# Patient Record
Sex: Female | Born: 1935 | Race: White | Hispanic: No | Smoking: Former smoker
Health system: Southern US, Community
[De-identification: ages and names within clinical notes are randomized; demographics above are authoritative.]

## PROBLEM LIST (undated history)

## (undated) DIAGNOSIS — J309 Allergic rhinitis, unspecified: Secondary | ICD-10-CM

## (undated) DIAGNOSIS — H25019 Cortical age-related cataract, unspecified eye: Secondary | ICD-10-CM

## (undated) DIAGNOSIS — I872 Venous insufficiency (chronic) (peripheral): Secondary | ICD-10-CM

## (undated) DIAGNOSIS — M533 Sacrococcygeal disorders, not elsewhere classified: Secondary | ICD-10-CM

## (undated) DIAGNOSIS — F39 Unspecified mood [affective] disorder: Secondary | ICD-10-CM

## (undated) DIAGNOSIS — M21371 Foot drop, right foot: Secondary | ICD-10-CM

## (undated) DIAGNOSIS — Z96641 Presence of right artificial hip joint: Secondary | ICD-10-CM

## (undated) DIAGNOSIS — N183 Chronic kidney disease, stage 3 unspecified: Secondary | ICD-10-CM

## (undated) DIAGNOSIS — E871 Hypo-osmolality and hyponatremia: Secondary | ICD-10-CM

## (undated) DIAGNOSIS — H3530 Unspecified macular degeneration: Secondary | ICD-10-CM

## (undated) DIAGNOSIS — M48061 Spinal stenosis, lumbar region without neurogenic claudication: Secondary | ICD-10-CM

## (undated) DIAGNOSIS — M810 Age-related osteoporosis without current pathological fracture: Secondary | ICD-10-CM

## (undated) DIAGNOSIS — I499 Cardiac arrhythmia, unspecified: Secondary | ICD-10-CM

## (undated) DIAGNOSIS — I341 Nonrheumatic mitral (valve) prolapse: Secondary | ICD-10-CM

## (undated) DIAGNOSIS — R5381 Other malaise: Secondary | ICD-10-CM

## (undated) DIAGNOSIS — K5900 Constipation, unspecified: Secondary | ICD-10-CM

## (undated) DIAGNOSIS — M1990 Unspecified osteoarthritis, unspecified site: Secondary | ICD-10-CM

## (undated) DIAGNOSIS — Z9189 Other specified personal risk factors, not elsewhere classified: Secondary | ICD-10-CM

## (undated) DIAGNOSIS — IMO0002 Reserved for concepts with insufficient information to code with codable children: Secondary | ICD-10-CM

## (undated) DIAGNOSIS — F419 Anxiety disorder, unspecified: Secondary | ICD-10-CM

## (undated) DIAGNOSIS — I10 Essential (primary) hypertension: Secondary | ICD-10-CM

## (undated) DIAGNOSIS — E559 Vitamin D deficiency, unspecified: Secondary | ICD-10-CM

## (undated) DIAGNOSIS — D649 Anemia, unspecified: Secondary | ICD-10-CM

## (undated) DIAGNOSIS — K219 Gastro-esophageal reflux disease without esophagitis: Secondary | ICD-10-CM

## (undated) DIAGNOSIS — J90 Pleural effusion, not elsewhere classified: Secondary | ICD-10-CM

## (undated) DIAGNOSIS — R5383 Other fatigue: Secondary | ICD-10-CM

## (undated) DIAGNOSIS — S2249XA Multiple fractures of ribs, unspecified side, initial encounter for closed fracture: Secondary | ICD-10-CM

## (undated) DIAGNOSIS — J189 Pneumonia, unspecified organism: Secondary | ICD-10-CM

## (undated) HISTORY — DX: Multiple fractures of ribs, unspecified side, initial encounter for closed fracture: S22.49XA

## (undated) HISTORY — DX: Sacrococcygeal disorders, not elsewhere classified: M53.3

## (undated) HISTORY — DX: Chronic kidney disease, stage 3 unspecified: N18.30

## (undated) HISTORY — PX: COLONOSCOPY: SHX174

## (undated) HISTORY — DX: Chronic kidney disease, stage 3 (moderate): N18.3

## (undated) HISTORY — DX: Other fatigue: R53.81

## (undated) HISTORY — DX: Unspecified macular degeneration: H35.30

## (undated) HISTORY — PX: CATARACT EXTRACTION W/ INTRAOCULAR LENS  IMPLANT, BILATERAL: SHX1307

## (undated) HISTORY — DX: Vitamin D deficiency, unspecified: E55.9

## (undated) HISTORY — PX: JOINT REPLACEMENT: SHX530

## (undated) HISTORY — DX: Hypo-osmolality and hyponatremia: E87.1

## (undated) HISTORY — DX: Unspecified osteoarthritis, unspecified site: M19.90

## (undated) HISTORY — PX: TONSILLECTOMY: SUR1361

## (undated) HISTORY — DX: Other fatigue: R53.83

## (undated) HISTORY — DX: Anxiety disorder, unspecified: F41.9

## (undated) HISTORY — DX: Cardiac arrhythmia, unspecified: I49.9

## (undated) HISTORY — DX: Allergic rhinitis, unspecified: J30.9

## (undated) HISTORY — DX: Anemia, unspecified: D64.9

## (undated) HISTORY — DX: Cortical age-related cataract, unspecified eye: H25.019

## (undated) HISTORY — DX: Age-related osteoporosis without current pathological fracture: M81.0

## (undated) HISTORY — DX: Spinal stenosis, lumbar region without neurogenic claudication: M48.061

## (undated) HISTORY — DX: Pleural effusion, not elsewhere classified: J90

## (undated) HISTORY — DX: Reserved for concepts with insufficient information to code with codable children: IMO0002

## (undated) HISTORY — DX: Foot drop, right foot: M21.371

## (undated) HISTORY — DX: Venous insufficiency (chronic) (peripheral): I87.2

## (undated) HISTORY — DX: Unspecified mood (affective) disorder: F39

## (undated) HISTORY — DX: Other specified personal risk factors, not elsewhere classified: Z91.89

## (undated) HISTORY — DX: Essential (primary) hypertension: I10

## (undated) HISTORY — DX: Presence of right artificial hip joint: Z96.641

---

## 2003-12-22 HISTORY — PX: TOTAL HIP ARTHROPLASTY: SHX124

## 2004-01-02 ENCOUNTER — Inpatient Hospital Stay (HOSPITAL_COMMUNITY): Admission: RE | Admit: 2004-01-02 | Discharge: 2004-01-06 | Payer: Self-pay | Admitting: Orthopedic Surgery

## 2007-11-29 ENCOUNTER — Ambulatory Visit (HOSPITAL_COMMUNITY): Admission: RE | Admit: 2007-11-29 | Discharge: 2007-11-29 | Payer: Self-pay | Admitting: Internal Medicine

## 2008-12-21 DIAGNOSIS — H25019 Cortical age-related cataract, unspecified eye: Secondary | ICD-10-CM

## 2008-12-21 HISTORY — PX: REPLACEMENT TOTAL KNEE: SUR1224

## 2008-12-21 HISTORY — DX: Cortical age-related cataract, unspecified eye: H25.019

## 2009-09-04 ENCOUNTER — Inpatient Hospital Stay (HOSPITAL_COMMUNITY): Admission: RE | Admit: 2009-09-04 | Discharge: 2009-09-07 | Payer: Self-pay | Admitting: Orthopedic Surgery

## 2011-03-27 LAB — CBC
HCT: 19.6 % — ABNORMAL LOW (ref 36.0–46.0)
HCT: 23.1 % — ABNORMAL LOW (ref 36.0–46.0)
HCT: 25.6 % — ABNORMAL LOW (ref 36.0–46.0)
HCT: 32.7 % — ABNORMAL LOW (ref 36.0–46.0)
Hemoglobin: 6.8 g/dL — CL (ref 12.0–15.0)
Hemoglobin: 8 g/dL — ABNORMAL LOW (ref 12.0–15.0)
Hemoglobin: 8.9 g/dL — ABNORMAL LOW (ref 12.0–15.0)
Hemoglobin: 11.3 g/dL — ABNORMAL LOW (ref 12.0–15.0)
MCHC: 34.4 g/dL (ref 30.0–36.0)
MCHC: 34.4 g/dL (ref 30.0–36.0)
MCHC: 34.8 g/dL (ref 30.0–36.0)
MCHC: 34.7 g/dL (ref 30.0–36.0)
MCV: 94.3 fL (ref 78.0–100.0)
MCV: 95.3 fL (ref 78.0–100.0)
MCV: 95.4 fL (ref 78.0–100.0)
MCV: 94.8 fL (ref 78.0–100.0)
Platelets: 132 10*3/uL — ABNORMAL LOW (ref 150–400)
Platelets: 136 10*3/uL — ABNORMAL LOW (ref 150–400)
Platelets: 146 10*3/uL — ABNORMAL LOW (ref 150–400)
Platelets: 240 10*3/uL (ref 150–400)
RBC: 2.06 MIL/uL — ABNORMAL LOW (ref 3.87–5.11)
RBC: 2.43 MIL/uL — ABNORMAL LOW (ref 3.87–5.11)
RBC: 2.72 MIL/uL — ABNORMAL LOW (ref 3.87–5.11)
RBC: 3.45 MIL/uL — ABNORMAL LOW (ref 3.87–5.11)
RDW: 13.3 % (ref 11.5–15.5)
RDW: 13.6 % (ref 11.5–15.5)
RDW: 14 % (ref 11.5–15.5)
RDW: 13.2 % (ref 11.5–15.5)
WBC: 6.2 10*3/uL (ref 4.0–10.5)
WBC: 6.6 10*3/uL (ref 4.0–10.5)
WBC: 9.1 10*3/uL (ref 4.0–10.5)
WBC: 6.8 10*3/uL (ref 4.0–10.5)

## 2011-03-27 LAB — COMPREHENSIVE METABOLIC PANEL
ALT: 27 U/L (ref 0–35)
AST: 32 U/L (ref 0–37)
Albumin: 4 g/dL (ref 3.5–5.2)
Alkaline Phosphatase: 74 U/L (ref 39–117)
BUN: 20 mg/dL (ref 6–23)
CO2: 27 mEq/L (ref 19–32)
Calcium: 9 mg/dL (ref 8.4–10.5)
Chloride: 96 mEq/L (ref 96–112)
Creatinine, Ser: 0.91 mg/dL (ref 0.4–1.2)
GFR calc Af Amer: >60 mL/min (ref >60)
GFR calc non Af Amer: 59 mL/min — ABNORMAL LOW (ref >60)
Glucose, Bld: 90 mg/dL (ref 70–99)
Potassium: 4.3 mEq/L (ref 3.5–5.1)
Sodium: 133 mEq/L — ABNORMAL LOW (ref 135–145)
Total Bilirubin: 0.7 mg/dL (ref 0.3–1.2)
Total Protein: 6.4 g/dL (ref 6.0–8.3)

## 2011-03-27 LAB — BASIC METABOLIC PANEL
BUN: 12 mg/dL (ref 6–23)
BUN: 7 mg/dL (ref 6–23)
BUN: 9 mg/dL (ref 6–23)
CO2: 24 mEq/L (ref 19–32)
CO2: 26 mEq/L (ref 19–32)
CO2: 29 mEq/L (ref 19–32)
Calcium: 6.9 mg/dL — ABNORMAL LOW (ref 8.4–10.5)
Calcium: 7.8 mg/dL — ABNORMAL LOW (ref 8.4–10.5)
Calcium: 8.1 mg/dL — ABNORMAL LOW (ref 8.4–10.5)
Chloride: 92 mEq/L — ABNORMAL LOW (ref 96–112)
Chloride: 94 mEq/L — ABNORMAL LOW (ref 96–112)
Chloride: 95 mEq/L — ABNORMAL LOW (ref 96–112)
Creatinine, Ser: 0.74 mg/dL (ref 0.4–1.2)
Creatinine, Ser: 0.79 mg/dL (ref 0.4–1.2)
Creatinine, Ser: 0.81 mg/dL (ref 0.4–1.2)
GFR calc Af Amer: >60 mL/min (ref >60)
GFR calc Af Amer: >60 mL/min (ref >60)
GFR calc Af Amer: >60 mL/min (ref >60)
GFR calc non Af Amer: >60 mL/min (ref >60)
GFR calc non Af Amer: >60 mL/min (ref >60)
GFR calc non Af Amer: >60 mL/min (ref >60)
Glucose, Bld: 109 mg/dL — ABNORMAL HIGH (ref 70–99)
Glucose, Bld: 127 mg/dL — ABNORMAL HIGH (ref 70–99)
Glucose, Bld: 148 mg/dL — ABNORMAL HIGH (ref 70–99)
Potassium: 3.6 mEq/L (ref 3.5–5.1)
Potassium: 3.6 mEq/L (ref 3.5–5.1)
Potassium: 3.9 mEq/L (ref 3.5–5.1)
Sodium: 122 mEq/L — ABNORMAL LOW (ref 135–145)
Sodium: 130 mEq/L — ABNORMAL LOW (ref 135–145)
Sodium: 130 mEq/L — ABNORMAL LOW (ref 135–145)

## 2011-03-27 LAB — CROSSMATCH
ABO/RH(D): O POS
Antibody Screen: NEGATIVE

## 2011-03-27 LAB — APTT: aPTT: 30 seconds (ref 24–37)

## 2011-03-27 LAB — PROTIME-INR
INR: 1.1 (ref 0.00–1.49)
INR: 1.6 — ABNORMAL HIGH (ref 0.00–1.49)
INR: 1.7 — ABNORMAL HIGH (ref 0.00–1.49)
INR: 1 (ref 0.00–1.49)
Prothrombin Time: 14.3 seconds (ref 11.6–15.2)
Prothrombin Time: 18.9 seconds — ABNORMAL HIGH (ref 11.6–15.2)
Prothrombin Time: 20 seconds — ABNORMAL HIGH (ref 11.6–15.2)
Prothrombin Time: 13 seconds (ref 11.6–15.2)

## 2011-03-27 LAB — ABO/RH: ABO/RH(D): O POS

## 2011-05-08 NOTE — Discharge Summary (Signed)
NAME:  [name], [name]                             ACCOUNT NO.:  [id number]   MEDICAL RECORD NO.:  [id number]                   PATIENT TYPE:  INP   LOCATION:  5035                                 FACILITY:  [facility]   PHYSICIAN:  Burnard Bunting, M.D.                 DATE OF BIRTH:  05/12/1924   DATE OF ADMISSION:  01/02/2004  DATE OF DISCHARGE:                                 DISCHARGE SUMMARY   DISCHARGE DIAGNOSIS:  Left hip arthritis.   SECONDARY DIAGNOSES:  History of hypertension and breast cancer.   OPERATION:  Left total hip replacement.   HISTORY OF PRESENT ILLNESS:  The patient is a 77 year old female with left  hip osteoarthritis. She has failed conservative care. She presents now for  operative management.   HOSPITAL COURSE:  The patient was admitted to the orthopedic service January 02, 2004.  At that time, she underwent left total hip arthroplasty. The  patient did well from that procedure.  She was started on Coumadin for DVT  prophylaxis.  She was started on weightbearing as tolerated.  Postop leg  lengths were equal and motor sensory function to the left lower extremity  was intact.  The patient's hemoglobin was 9.3 at the time of discharge, her  INR was 1.5.  The patient will followup with Dr. Lajoyce Corners in seven days.  She  will be weightbearing as tolerated.  She needs to keep the incision dry.   DISCHARGE MEDICATIONS:  1. Norvasc 5 mg p.o. q.d.  2. Multivitamin.  3. Hydrochlorothiazide 25 mg p.o. q.d.  4. Coumadin approximately 5 mg p.o. q.d. until INR 2 to 2.5.  5. Percocet 1 to 2 p.o. q. 3-4h. p.r.n. pain.                                                Burnard Bunting, M.D.    GSD/MEDQ  D:  01/05/2004  T:  01/05/2004  Job:  [id number]

## 2011-05-08 NOTE — H&P (Signed)
NAME:  [name], [name]                             ACCOUNT NO.:  [id number]   MEDICAL RECORD NO.:  [id number]                   PATIENT TYPE:  INP   LOCATION:  2550                                 FACILITY:  [facility]   PHYSICIAN:  Nadara Mustard, M.D.                DATE OF BIRTH:  [birth year]   DATE OF ADMISSION:  01/02/2004  DATE OF DISCHARGE:                                HISTORY & PHYSICAL   HISTORY OF PRESENT ILLNESS:  The patient is a 77 year old woman with  osteoarthritis of her left hip. The patient has failed conservative care  including activity modification and nonsteroidals and presents at this time  for a total hip arthroplasty.   ALLERGIES:  No known drug allergies.   MEDICATIONS:  1. Norvasc 5 mg daily.  2. Multivitamin daily.  3. Hydrochlorothiazide 25 mg daily.   PAST MEDICAL HISTORY:  No previous surgery.   SOCIAL HISTORY:  Positive for alcohol, negative for tobacco.   FAMILY HISTORY:  Positive for breast cancer and hypertension.   REVIEW OF SYSTEMS:  Positive for osteoarthritis and hypertension.   PHYSICAL EXAMINATION:  VITAL SIGNS:  Temperature 97.3, pulse 74, respiratory  rate 16, blood pressure 126/78.  GENERAL:  She was in no acute distress.  LUNGS:  Clear to auscultation.  CARDIOVASCULAR:  Regular rate and rhythm.  NECK:  Supple. No bruits.  EXTREMITIES:  Examination of her left lower extremity she has internal and  external rotation of 20 degrees of the left hip. She has extension to  neutral, flexion to 90 degrees. Radiograph shows left hip bone on bone  contact, osteoarthritis.   ASSESSMENT:  Osteoarthritis left hip.   PLAN:  The patient is scheduled for a left total hip arthroplasty at this  time. The risks and benefits were discussed including infection,  neurovascular injury, persistent pain, failure of the implant, dislocation,  DVT, pulmonary embolus, as well as leg length inequality. The patient states  she understands and wishes to proceed at  this time. She wishes to plan for  rehab at [facility].                                                Nadara Mustard, M.D.    MVD/MEDQ  D:  01/02/2004  T:  01/02/2004  Job:  [id number]

## 2011-05-08 NOTE — Op Note (Signed)
NAME:  [name], [name]                             ACCOUNT NO.:  [id number]   MEDICAL RECORD NO.:  [id number]                   PATIENT TYPE:  INP   LOCATION:  2550                                 FACILITY:  [facility]   PHYSICIAN:  Nadara Mustard, M.D.                DATE OF BIRTH:  1924/11/28   DATE OF PROCEDURE:  01/02/2004  DATE OF DISCHARGE:                                 OPERATIVE REPORT   PREOPERATIVE DIAGNOSIS:  Left hip osteoarthritis.   POSTOPERATIVE DIAGNOSIS:  Left hip osteoarthritis.   PROCEDURE:  Left total hip arthroplasty with a #2 Accolade stent; 50-mm  Osteonics acetabulum, +0 neck, 10-degree liner.   SURGEON:  Nadara Mustard, M.D.   ANESTHESIA:  General.   ESTIMATED BLOOD LOSS:  250 mL.   ANTIBIOTICS:  1 g of Kefzol.   DISPOSITION:  To PACU in stable condition.   INDICATIONS FOR PROCEDURE:  The patient is a 77 year old woman with severe  osteoarthritis of her left hip.  The patient states she has failed  conservative care and presents at this time for total hip arthroplasty. The  risk and benefits were discussed including infection, neurovascular injury,  persistent pain, dislocation of the hip, DVT, pulmonary embolus as well as  leg length in equality.  The patient states she understands and wishes to  proceed at this time.   DESCRIPTION OF PROCEDURE:  The patient was brought to OR room 4 and  underwent general anesthetic. After adequate level of anesthesia obtained,  the patient was placed in the right lateral decubitus position with the left  side up and her left lower extremity was prepped using DuraPrep, draped into  sterile field and I-band was used to cover all exposed skin.  A posterior  lateral incision was made.  This was carried down through the tensor fascia  lata which was split and a Charnley retractor placed.  The piriformis and  short external rotators were tagged, cut and retracted.  The capsule was  T'd, tagged and retracted. The hip was  dislocated and the neck cut was made  1 cm proximal to the calcar.  Attention was first focused on the acetabulum.  The labrum was excised, bony osteophytes were removed.  The acetabulum was  sequentially reamed to a 50 mm.  The 50 mm liner shell was placed.  Attention was then focused on the femur.  The femur was started with the all  then was sequentially broached to a #2 trial stem.  The trial acetabular  liner was placed with a 10 degree lip and the 32-mm head was inserted on the  stem and this was placed through a range of motion.  The patient had full  flexion of 100 degrees, full adduction and internal rotation of  approximately 60 degrees.  She had full extension and extension rotation  without any instability.  The trial implants were removed.  The wound was  irrigated throughout the case.  The centralizer plug was placed in the  acetabulum and the liner was placed with the 10-degree liner.  The final  stem was inserted with the +0 neck, 32-mm head.  The wound was again  irrigated and the hip reduced.  The hip was again placed through a full  range of motion.  She had full flexion of 100 degrees, full adduction and  internal rotation of approximately 60 degrees with both neutral and  adduction with flexion.  She had full extension with external rotation with  no instability.  The wound was again irrigated. The capsule was  reapproximated with the #1 Ethibond.  The piriformis and short external  rotators were reapproximated with a #1 Ethibond.  The sciatic nerve was  protected throughout the case.  The tensor fascia lata was closed using a #1  Vicryl running.  The subcu was closed using 2-0 Vicryl.  The skin was closed  using approximated staples.  The wound was covered with Adaptic orthopedic  sponges, ABD dressing, Hypafix tape and a knee immobilizer was applied.  The  patient was extubated and taken to the PACU in stable condition.                                                Nadara Mustard, M.D.    MVD/MEDQ  D:  01/02/2004  T:  01/02/2004  Job:  [id number]

## 2011-12-01 ENCOUNTER — Other Ambulatory Visit: Payer: Self-pay | Admitting: Dermatology

## 2011-12-01 ENCOUNTER — Ambulatory Visit
Admission: RE | Admit: 2011-12-01 | Discharge: 2011-12-01 | Disposition: A | Discharge status: Home / Self Care | Payer: Medicare Other | Source: Ambulatory Visit | Attending: Dermatology | Admitting: Dermatology

## 2011-12-01 DIAGNOSIS — L299 Pruritus, unspecified: Secondary | ICD-10-CM

## 2013-06-20 DIAGNOSIS — IMO0002 Reserved for concepts with insufficient information to code with codable children: Secondary | ICD-10-CM

## 2013-06-20 HISTORY — DX: Reserved for concepts with insufficient information to code with codable children: IMO0002

## 2013-07-06 ENCOUNTER — Encounter: Payer: Self-pay | Admitting: Geriatric Medicine

## 2013-07-06 ENCOUNTER — Non-Acute Institutional Stay (SKILLED_NURSING_FACILITY): Payer: Medicare Other | Admitting: Geriatric Medicine

## 2013-07-06 DIAGNOSIS — M25551 Pain in right hip: Secondary | ICD-10-CM

## 2013-07-06 DIAGNOSIS — M25559 Pain in unspecified hip: Secondary | ICD-10-CM

## 2013-07-06 DIAGNOSIS — S81809A Unspecified open wound, unspecified lower leg, initial encounter: Secondary | ICD-10-CM | POA: Insufficient documentation

## 2013-07-06 DIAGNOSIS — K5900 Constipation, unspecified: Secondary | ICD-10-CM

## 2013-07-06 NOTE — Progress Notes (Signed)
Patient ID: [name], female   DOB: March 09, 1924, 79 y.o.   MRN: [id number] [facility] [phone])  Chief Complaint  Patient presents with  . Back Pain    HPI: This is a 79 y.o. female resident of [facility] Independent Living  section. This patient had a fall 06/24/2013 while visiting in [facility]. She was apparently carrying frogs in the house and went outside fell on a brick sidewalk, fell on her right side. She got herself up initially no pain. She notes that she did hit her forehead, no loss of consciousness. She immediately noticed a hematoma on her right shin, he developed bruising of the left lateral and lateral thigh and hip on the right. The next day she developed some right hip pain did not treat herself with any medication no ice application. She drove herself home on July 6, had no pain until she got out of the car. Her right hip pain has gotten progressively worse since that time. She was evaluated at the orthopedic office on July 8 with a right hip x-ray that revealed no fracture pain progressed to involve her lower back, she returned for orthopedic office on July 16 had x-ray of her lower back that did not reveal any fracture. Today patient reports severe back pain, right lower back and buttock, worse than any of the pain she's had. She is unable to roll over today. This patient's primary care provider is Dr. Felipa Eth, while in the rehabilitation section she will be followed by [facility] Patient's other medical problems include fatigue, stress, hypertension, degenerative joint disease. She reports fatigue is much better after her recent increase in Cymbalta dose to 60 mg daily.   No Known Allergies Medications Reviewed     PMH  Past Medical History  Diagnosis Date  . Arthritis   . Osteoporosis   . Essential hypertension, benign   . Osteoarthrosis, unspecified whether generalized or localized, unspecified site   .  Other malaise and fatigue     re: stress, anxiety  . Allergic rhinitis, cause unspecified   . Osteoporosis/osteopenia increased risk   . Macular degeneration   . Venous insufficiency   . Cataract cortical, senile 2010    s/p bil extract/IOL implants    Past Surgical History  Procedure Laterality Date  . Total hip arthroplasty Left 2005  . Replacement total knee Right 2010   Family History  Problem Relation Age of Onset  . Esophageal cancer Mother 72  . Breast cancer Daughter   . Thyroid disease Daughter    History   Social History Narrative    Widowed 1988.  35yr college education.  Patient lives in  single level home at [facility] since 2003. Lives alone, continues to drive, attends social activities. Stopped smoking many years ago, drinks daily alcohol, 3oz. Scotch.   Patient has Advanced planning documents: Living Will, DNR            Review of Systems  DATA OBTAINED: from patient,  GENERAL:   No fevers, fatigue, change in appetite or weight SKIN: No itch, rash. Wound rt. Lower leg EYES: No eye pain, dryness or itching  No change in vision EARS: No earache, tinnitus, change in hearing NOSE: No congestion, drainage or bleeding MOUTH/THROAT: No mouth or tooth pain  No sore throat No difficulty chewing or swallowing RESPIRATORY: No cough, wheezing, SOB CARDIAC: No chest pain, palpitations  No edema. GI: No abdominal pain  No N/V/D. Constipation  No heartburn or reflux  GU: No dysuria, frequency or urgency  No change in urine volume or character   MUSCULOSKELETAL: No joint pain, swelling or stiffness  Back pain- see HPI   NEUROLOGIC: No dizziness, fainting, headache,   No change in mental status.  PSYCHIATRIC: No feelings of anxiety, depression Sleeps well.    Physical Exam Filed Vitals:   07/06/13 1425  BP: 134/71  Pulse: 83  Temp: 97.8 F (36.6 C)  Resp: 20  Height: 5\' 3"  (1.6 m)  Weight: 138 lb (62.596 kg)  SpO2: 96%   Body mass index is  24.45 kg/(m^2).  GENERAL APPEARANCE: No acute distress, appropriately groomed, normal body habitus. Alert, pleasant, conversant. SKIN: No diaphoresis, rash, unusual lesions  Resolving hematoma right anterior shin. Significant ecchymosis over the lateral right hip. HEAD: Normocephalic, resolving small hematoma right forehead EYES: Conjunctiva/lids clear. Pupils round, reactive. EOMs intact.  EARS: External exam WNL,. Hearing grossly normal. NOSE: No deformity or discharge. MOUTH/THROAT: Lips w/o lesions. Oral mucosa, tongue moist, w/o lesion. Oropharynx w/o redness or lesions.  NECK: Supple, full ROM. No thyroid tenderness, enlargement or nodule LYMPHATICS: No head, neck or supraclavicular adenopathy RESPIRATORY: Breathing is even, unlabored. Lung sounds are clear and full.  CARDIOVASCULAR: Heart RRR. No murmur or extra heart sounds  ARTERIAL: No carotid, bruit. DP,PT pulse 2+.  VENOUS: Venous stasis skin changes bilateral lower legs  EDEMA: No peripheral or periorbital edema.  GASTROINTESTINAL: Abdomen is soft, non-tender, not distended w/ normal bowel sounds. MUSCULOSKELETAL: Moves UE extremities with full ROM, strength and tone. Back is without kyphosis, scoliosis. Tender lateral right hip, unable to lift leg straight up without severe pain, pain is posterior NEUROLOGIC: Oriented to time, place, person. Cranial nerves 2-12 grossly intact, speech clear, no tremor.  PSYCHIATRIC: Mood and affect appropriate to situation  ASSESSMENT/PLAN   Right hip pain Persistent and worsening right hip and right lower back pain after fall 06/24/2013. Recent plain films of the right hip and lower back have been negative for fractures. Current presentation is concerning for pelvic fracture or sacral fracture. Will obtain x-rays of the right hip femur pelvis and sacrum. Change HEENT change pain management Vicodin 04/22/2024 q.6 hours scheduled. Nursing staff continue to monitor this patient closely and assist  with all transfers and ADLs  Unspecified constipation Patient reports constipation is usually a problem for her, this will be worsened with scheduled Vicodin. Start daily dosing of MiraLax.     Follow up: As needed  Thomasena Vandenheuvel T.Atianna Haidar, NP-C 07/06/2013

## 2013-07-07 ENCOUNTER — Inpatient Hospital Stay (HOSPITAL_COMMUNITY)
Admission: EM | Admit: 2013-07-07 | Discharge: 2013-07-09 | DRG: 552 | Disposition: A | Discharge status: Skilled Nursing Facility | Payer: Medicare Other | Attending: Orthopedic Surgery | Admitting: Orthopedic Surgery

## 2013-07-07 ENCOUNTER — Encounter (HOSPITAL_COMMUNITY): Payer: Self-pay | Admitting: Emergency Medicine

## 2013-07-07 DIAGNOSIS — Z79899 Other long term (current) drug therapy: Secondary | ICD-10-CM

## 2013-07-07 DIAGNOSIS — Z96659 Presence of unspecified artificial knee joint: Secondary | ICD-10-CM

## 2013-07-07 DIAGNOSIS — M51379 Other intervertebral disc degeneration, lumbosacral region without mention of lumbar back pain or lower extremity pain: Secondary | ICD-10-CM | POA: Diagnosis present

## 2013-07-07 DIAGNOSIS — S3210XA Unspecified fracture of sacrum, initial encounter for closed fracture: Principal | ICD-10-CM

## 2013-07-07 DIAGNOSIS — W19XXXA Unspecified fall, initial encounter: Secondary | ICD-10-CM | POA: Diagnosis present

## 2013-07-07 DIAGNOSIS — Z96649 Presence of unspecified artificial hip joint: Secondary | ICD-10-CM

## 2013-07-07 DIAGNOSIS — M545 Low back pain: Secondary | ICD-10-CM

## 2013-07-07 DIAGNOSIS — M5137 Other intervertebral disc degeneration, lumbosacral region: Secondary | ICD-10-CM | POA: Diagnosis present

## 2013-07-07 HISTORY — DX: Unspecified osteoarthritis, unspecified site: M19.90

## 2013-07-07 HISTORY — DX: Age-related osteoporosis without current pathological fracture: M81.0

## 2013-07-07 LAB — URINALYSIS, ROUTINE W REFLEX MICROSCOPIC
Bilirubin Urine: NEGATIVE
Hgb urine dipstick: NEGATIVE
Ketones, ur: NEGATIVE mg/dL
Specific Gravity, Urine: 1.012 (ref 1.005–1.030)
pH: 5.5 (ref 5.0–8.0)

## 2013-07-07 LAB — URINE MICROSCOPIC-ADD ON

## 2013-07-07 MED ORDER — OCUVITE PO TABS
1.0000 | ORAL_TABLET | Freq: Every day | ORAL | Status: DC
  Administered 2013-07-08 – 2013-07-09 (×2): 1 via ORAL
  Filled 2013-07-07 (×2): qty 1

## 2013-07-07 MED ORDER — AMLODIPINE BESYLATE 5 MG PO TABS
5.0000 mg | ORAL_TABLET | Freq: Every day | ORAL | Status: DC
  Administered 2013-07-08 – 2013-07-09 (×2): 5 mg via ORAL
  Filled 2013-07-07 (×2): qty 1

## 2013-07-07 MED ORDER — DULOXETINE HCL 60 MG PO CPEP
60.0000 mg | ORAL_CAPSULE | Freq: Every day | ORAL | Status: DC
  Administered 2013-07-08 – 2013-07-09 (×2): 60 mg via ORAL
  Filled 2013-07-07 (×2): qty 1

## 2013-07-07 MED ORDER — SENNA 8.6 MG PO TABS
1.0000 | ORAL_TABLET | Freq: Every evening | ORAL | Status: DC | PRN
  Administered 2013-07-08: 8.6 mg via ORAL
  Filled 2013-07-07: qty 1

## 2013-07-07 MED ORDER — POLYETHYLENE GLYCOL 3350 17 G PO PACK
17.0000 g | PACK | Freq: Every day | ORAL | Status: DC
  Administered 2013-07-08 – 2013-07-09 (×2): 17 g via ORAL
  Filled 2013-07-07 (×2): qty 1

## 2013-07-07 MED ORDER — GABAPENTIN 100 MG PO CAPS
100.0000 mg | ORAL_CAPSULE | Freq: Every day | ORAL | Status: DC
  Administered 2013-07-08: 100 mg via ORAL
  Filled 2013-07-07 (×3): qty 1

## 2013-07-07 MED ORDER — VITAMIN D (ERGOCALCIFEROL) 1.25 MG (50000 UNIT) PO CAPS
50000.0000 [IU] | ORAL_CAPSULE | ORAL | Status: DC
  Administered 2013-07-08: 50000 [IU] via ORAL
  Filled 2013-07-07: qty 1

## 2013-07-07 MED ORDER — POTASSIUM CHLORIDE CRYS ER 20 MEQ PO TBCR
20.0000 meq | EXTENDED_RELEASE_TABLET | Freq: Every day | ORAL | Status: DC
  Administered 2013-07-08 – 2013-07-09 (×2): 20 meq via ORAL
  Filled 2013-07-07 (×2): qty 1

## 2013-07-07 MED ORDER — CYCLOBENZAPRINE HCL 10 MG PO TABS
10.0000 mg | ORAL_TABLET | Freq: Three times a day (TID) | ORAL | Status: DC | PRN
  Administered 2013-07-08 – 2013-07-09 (×4): 10 mg via ORAL
  Filled 2013-07-07 (×4): qty 1

## 2013-07-07 MED ORDER — PREDNISONE 20 MG PO TABS
40.0000 mg | ORAL_TABLET | Freq: Once | ORAL | Status: AC
  Administered 2013-07-08: 40 mg via ORAL
  Filled 2013-07-07 (×2): qty 2

## 2013-07-07 MED ORDER — IRBESARTAN 75 MG PO TABS
75.0000 mg | ORAL_TABLET | Freq: Every day | ORAL | Status: DC
  Administered 2013-07-08 – 2013-07-09 (×2): 75 mg via ORAL
  Filled 2013-07-07 (×2): qty 1

## 2013-07-07 MED ORDER — HYDROMORPHONE HCL PF 1 MG/ML IJ SOLN: 0.5000 mg | INTRAMUSCULAR | Status: DC | PRN

## 2013-07-07 MED ORDER — ASPIRIN EC 81 MG PO TBEC
81.0000 mg | DELAYED_RELEASE_TABLET | Freq: Every day | ORAL | Status: DC
  Administered 2013-07-08: 81 mg via ORAL
  Filled 2013-07-07: qty 1

## 2013-07-07 MED ORDER — HYDROCHLOROTHIAZIDE 12.5 MG PO CAPS
12.5000 mg | ORAL_CAPSULE | Freq: Every day | ORAL | Status: DC
  Administered 2013-07-08 – 2013-07-09 (×2): 12.5 mg via ORAL
  Filled 2013-07-07 (×2): qty 1

## 2013-07-07 NOTE — ED Provider Notes (Signed)
History    CSN: [id number] Arrival date & time 07/07/13  2113  First MD Initiated Contact with Patient 07/07/13 2141     Chief Complaint  Patient presents with  . Fall   (Consider location/radiation/quality/duration/timing/severity/associated sxs/prior Treatment) HPI patient tripped and fell on 06/24/2013 suffering ground-level fall. Since then she complains of low back pain back pain. She reports some numbness in her right lower leg for the past few days and has had some trouble in getting her bladder. She was sent here today for further evaluation by Dr.Duda. Pain occurs when she moves or walks improved with remaining still. She denies pain presently. Past Medical History  Diagnosis Date  . Arthritis   . Osteoporosis    Past Surgical History  Procedure Laterality Date  . Total hip arthroplasty    . Replacement total knee     No family history on file. History  Substance Use Topics  . Smoking status: Former Smoker    Quit date: 07/07/1963  . Smokeless tobacco: Not on file  . Alcohol Use: 4.2 oz/week    7 Shots of liquor per week     Comment: drinks scotch daily.    OB History   Grav Para Term Preterm Abortions TAB SAB Ect Mult Living                 Review of Systems  Constitutional: Negative.   HENT: Negative.   Respiratory: Negative.   Cardiovascular: Negative.   Gastrointestinal: Negative.   Genitourinary: Positive for urgency.  Musculoskeletal: Positive for back pain.  Skin: Negative.   Neurological: Positive for numbness.  Psychiatric/Behavioral: Negative.   All other systems reviewed and are negative.    Allergies  Review of patient's allergies indicates no known allergies.  Home Medications   Current Outpatient Rx  Name  Route  Sig  Dispense  Refill  . amLODipine (NORVASC) 5 MG tablet   Oral   Take 5 mg by mouth daily.         Marland Kitchen aspirin EC 81 MG tablet   Oral   Take 81 mg by mouth daily.         . beta carotene w/minerals (OCUVITE)  tablet   Oral   Take 1 tablet by mouth daily.         . cyclobenzaprine (FLEXERIL) 10 MG tablet   Oral   Take 10 mg by mouth 3 (three) times daily as needed for muscle spasms.         . DULoxetine (CYMBALTA) 60 MG capsule   Oral   Take 60 mg by mouth daily.         . ergocalciferol (VITAMIN D2) 50000 UNITS capsule   Oral   Take 50,000 Units by mouth once a week.         . gabapentin (NEURONTIN) 100 MG capsule   Oral   Take 100 mg by mouth at bedtime.         . hydrochlorothiazide (MICROZIDE) 12.5 MG capsule   Oral   Take 12.5 mg by mouth daily.         Marland Kitchen HYDROcodone-acetaminophen (NORCO/VICODIN) 5-325 MG per tablet   Oral   Take 1 tablet by mouth every 6 (six) hours as needed for pain.         Marland Kitchen ibuprofen (ADVIL,MOTRIN) 200 MG tablet   Oral   Take 200 mg by mouth every 6 (six) hours as needed for pain.         Marland Kitchen  olmesartan (BENICAR) 20 MG tablet   Oral   Take 20 mg by mouth daily.         . polyethylene glycol (MIRALAX / GLYCOLAX) packet   Oral   Take 17 g by mouth daily.         . potassium chloride SA (K-DUR,KLOR-CON) 20 MEQ tablet   Oral   Take 20 mEq by mouth daily.         . predniSONE (DELTASONE) 10 MG tablet   Oral   Take 40 mg by mouth once.         . senna (SENOKOT) 8.6 MG TABS   Oral   Take 1 tablet by mouth at bedtime as needed (constipation).          BP 124/61  Pulse 86  Temp(Src) 98.2 F (36.8 C) (Oral)  Resp 21  SpO2 92% Physical Exam  Nursing note and vitals reviewed. Constitutional: She is oriented to person, place, and time. She appears well-developed and well-nourished.  HENT:  Head: Normocephalic.  Purplish ecchymosis at right for head otherwise normocephalic atraumatic  Eyes: Conjunctivae are normal. Pupils are equal, round, and reactive to light.  Neck: Neck supple. No tracheal deviation present. No thyromegaly present.  Cardiovascular: Normal rate and regular rhythm.   No murmur  heard. Pulmonary/Chest: Effort normal and breath sounds normal.  Abdominal: Soft. Bowel sounds are normal. She exhibits no distension. There is no tenderness.  Musculoskeletal: Normal range of motion. She exhibits no edema and no tenderness.  Entire spine nontender. Pain at lumbar area when she moves in bed. Motor strength 5 over 5 overall  Neurological: She is alert and oriented to person, place, and time. She has normal reflexes. No cranial nerve deficit. Coordination normal.  Skin: Skin is warm and dry. No rash noted.  Psychiatric: She has a normal mood and affect.    ED Course  Procedures (including critical care time) Labs Reviewed  URINALYSIS, ROUTINE W REFLEX MICROSCOPIC   No results found. No diagnosis found.  MDM  Foley catheter placed as patient has feeling of urinary urgency and has trouble moving. Spoke with Dr.Duda plan 23 hour observation MRI in a.m. Diagnosis #1 fall #2 low back pain #3 paresthesias  Doug Sou, MD 07/07/13 2219

## 2013-07-07 NOTE — ED Notes (Signed)
Per ems- pt from [facility]. fell on July 4th and has since had lower back pain with numbness in right foot. X-ray did not show anything remarkable. And here for MRI.

## 2013-07-08 ENCOUNTER — Inpatient Hospital Stay (HOSPITAL_COMMUNITY): Payer: Medicare Other

## 2013-07-08 ENCOUNTER — Encounter: Payer: Self-pay | Admitting: Geriatric Medicine

## 2013-07-08 DIAGNOSIS — M25551 Pain in right hip: Secondary | ICD-10-CM | POA: Insufficient documentation

## 2013-07-08 DIAGNOSIS — S3210XA Unspecified fracture of sacrum, initial encounter for closed fracture: Secondary | ICD-10-CM

## 2013-07-08 DIAGNOSIS — M48061 Spinal stenosis, lumbar region without neurogenic claudication: Secondary | ICD-10-CM

## 2013-07-08 DIAGNOSIS — K5903 Drug induced constipation: Secondary | ICD-10-CM | POA: Insufficient documentation

## 2013-07-08 HISTORY — DX: Spinal stenosis, lumbar region without neurogenic claudication: M48.061

## 2013-07-08 MED ORDER — ASPIRIN EC 325 MG PO TBEC
325.0000 mg | DELAYED_RELEASE_TABLET | Freq: Every day | ORAL | Status: DC
  Administered 2013-07-09: 325 mg via ORAL
  Filled 2013-07-08: qty 1

## 2013-07-08 MED ORDER — HYDROMORPHONE HCL PF 1 MG/ML IJ SOLN
0.5000 mg | INTRAMUSCULAR | Status: DC | PRN
  Administered 2013-07-08 (×2): 0.5 mg via INTRAVENOUS
  Filled 2013-07-08 (×2): qty 1

## 2013-07-08 MED ORDER — HYDROCODONE-ACETAMINOPHEN 5-325 MG PO TABS
1.0000 | ORAL_TABLET | ORAL | Status: DC | PRN
  Administered 2013-07-08 – 2013-07-09 (×4): 2 via ORAL
  Filled 2013-07-08 (×4): qty 2

## 2013-07-08 MED ORDER — PREDNISONE 20 MG PO TABS
20.0000 mg | ORAL_TABLET | Freq: Every day | ORAL | Status: DC
  Administered 2013-07-08 – 2013-07-09 (×2): 20 mg via ORAL
  Filled 2013-07-08 (×4): qty 1

## 2013-07-08 NOTE — Clinical Social Work Psychosocial (Signed)
Clinical Social Work Department BRIEF PSYCHOSOCIAL ASSESSMENT 07/08/2013  Patient:  [name], [name]     Account Number:  [id number]     Admit date:  07/07/2013  Clinical Social Worker:  Demetrios Loll  Date/Time:  07/08/2013 04:53 PM  Referred by:  Physician  Date Referred:  07/08/2013 Referred for  Other - See comment   Other Referral:   Pt was admitted from [facility]   Interview type:  Patient Other interview type:    PSYCHOSOCIAL DATA Living Status:  FACILITY Admitted from facility:  [facility] Level of care:  Skilled Nursing Facility Primary support name:  Dull,Jill Primary support relationship to patient:  CHILD, ADULT Degree of support available:   supportive    CURRENT CONCERNS Current Concerns  Post-Acute Placement   Other Concerns:    SOCIAL WORK ASSESSMENT / PLAN CSW met with pt to address consult. CSW introduced herself and explained role of social work. CSW also explained the process of discharging and returning to [facility]. Pt shared that she lives in Independent Living and is currently in the SNF portion of [facility] for rehab. Pt shared that she would like to return to SNF at discharge and continue to her physical therapy. CSW will follow with facility and facilitate discharge to SNF. CSW will continue to follow.   Assessment/plan status:  Psychosocial Support/Ongoing Assessment of Needs Other assessment/ plan:   Information/referral to community resources:   Pt is a resident of [facility].    PATIENT'S/FAMILY'S RESPONSE TO PLAN OF CARE: Pt was very pleasant and is agreeable to discharge plan.   Dede Query, MSW, LCSW Clinical Social Worker [facility] [phone]

## 2013-07-08 NOTE — H&P (Signed)
[name] is an 79 y.o. female.   Chief Complaint: Lower back pain with right-sided radicular symptoms with difficulty urinating. HPI: Patient is an 79 year old woman resident at [facility] who states that she had a ground-level fall trying to help frog. Patient complains of pain with trying to stand in her lower back numbness in her right foot.  Past Medical History  Diagnosis Date  . Arthritis   . Osteoporosis     Past Surgical History  Procedure Laterality Date  . Total hip arthroplasty    . Replacement total knee      No family history on file. Social History:  reports that she quit smoking about 50 years ago. She does not have any smokeless tobacco history on file. She reports that she drinks about 4.2 ounces of alcohol per week. She reports that she does not use illicit drugs.  Allergies: No Known Allergies  Medications Prior to Admission  Medication Sig Dispense Refill  . amLODipine (NORVASC) 5 MG tablet Take 5 mg by mouth daily.      Marland Kitchen aspirin EC 81 MG tablet Take 81 mg by mouth daily.      . beta carotene w/minerals (OCUVITE) tablet Take 1 tablet by mouth daily.      . cyclobenzaprine (FLEXERIL) 10 MG tablet Take 10 mg by mouth 3 (three) times daily as needed for muscle spasms.      . DULoxetine (CYMBALTA) 60 MG capsule Take 60 mg by mouth daily.      . ergocalciferol (VITAMIN D2) 50000 UNITS capsule Take 50,000 Units by mouth once a week.      . gabapentin (NEURONTIN) 100 MG capsule Take 100 mg by mouth at bedtime.      . hydrochlorothiazide (MICROZIDE) 12.5 MG capsule Take 12.5 mg by mouth daily.      Marland Kitchen HYDROcodone-acetaminophen (NORCO/VICODIN) 5-325 MG per tablet Take 1 tablet by mouth every 6 (six) hours as needed for pain.      Marland Kitchen ibuprofen (ADVIL,MOTRIN) 200 MG tablet Take 200 mg by mouth every 6 (six) hours as needed for pain.      Marland Kitchen olmesartan (BENICAR) 20 MG tablet Take 20 mg by mouth daily.      . polyethylene glycol (MIRALAX / GLYCOLAX) packet Take 17 g by mouth  daily.      . potassium chloride SA (K-DUR,KLOR-CON) 20 MEQ tablet Take 20 mEq by mouth daily.      . predniSONE (DELTASONE) 10 MG tablet Take 40 mg by mouth once.      . senna (SENOKOT) 8.6 MG TABS Take 1 tablet by mouth at bedtime as needed (constipation).        Results for orders placed during the hospital encounter of 07/07/13 (from the past 48 hour(s))  URINALYSIS, ROUTINE W REFLEX MICROSCOPIC     Status: Abnormal   Collection Time    07/07/13 10:16 PM      Result Value Range   Color, Urine YELLOW  YELLOW   APPearance CLOUDY (*) CLEAR   Specific Gravity, Urine 1.012  1.005 - 1.030   pH 5.5  5.0 - 8.0   Glucose, UA NEGATIVE  NEGATIVE mg/dL   Hgb urine dipstick NEGATIVE  NEGATIVE   Bilirubin Urine NEGATIVE  NEGATIVE   Ketones, ur NEGATIVE  NEGATIVE mg/dL   Protein, ur NEGATIVE  NEGATIVE mg/dL   Urobilinogen, UA 0.2  0.0 - 1.0 mg/dL   Nitrite POSITIVE (*) NEGATIVE   Leukocytes, UA SMALL (*) NEGATIVE  URINE MICROSCOPIC-ADD ON  Status: Abnormal   Collection Time    07/07/13 10:16 PM      Result Value Range   Squamous Epithelial / LPF RARE  RARE   WBC, UA 3-6  <3 WBC/hpf   RBC / HPF 0-2  <3 RBC/hpf   Bacteria, UA FEW (*) RARE   No results found.  Review of Systems  All other systems reviewed and are negative.    Blood pressure 131/68, pulse 81, temperature 98.5 F (36.9 C), temperature source Oral, resp. rate 16, height 5\' 3"  (1.6 m), weight 62.596 kg (138 lb), SpO2 93.00%. Physical Exam  On examination patient is alert oriented she denies any back pain while lying in bed. She notes no significant change with the prednisone that she received last night. Examination patient has slight weakness with EHL strength on the right and ankle dorsiflexion the right as well as E. version on the right with strength about 4/5 compared to the left. She states that resisted extension is painful. She has a positive straight leg raise on the right. Good strength with plantar flexion.  Previous radiographs showed a history of pelvic insufficiency fractures radiographs obtained in the office last week showed no acute compression fractures. Her spine is nontender. Assessment/Plan Assessment: Right sided radicular pain with radiculopathy and weakness rule out herniated disc versus acute compression fracture.  Plan: We'll obtain MRI scan of her lumbar spine this morning. Further treatment pending results of the MRI scan. Plan for discharge back to assisted living [facility].  Keyoni Lapinski V 07/08/2013, 7:09 AM

## 2013-07-08 NOTE — Assessment & Plan Note (Signed)
Patient reports constipation is usually a problem for her, this will be worsened with scheduled Vicodin. Start daily dosing of MiraLax.

## 2013-07-08 NOTE — Assessment & Plan Note (Signed)
Persistent and worsening right hip and right lower back pain after fall 06/24/2013. Recent plain films of the right hip and lower back have been negative for fractures. Current presentation is concerning for pelvic fracture or sacral fracture. Will obtain x-rays of the right hip femur pelvis and sacrum. Change HEENT change pain management Vicodin 04/22/2024 q.6 hours scheduled. Nursing staff continue to monitor this patient closely and assist with all transfers and ADLs

## 2013-07-09 LAB — GLUCOSE, CAPILLARY: Glucose-Capillary: 102 mg/dL — ABNORMAL HIGH (ref 70–99)

## 2013-07-09 MED ORDER — PREDNISONE (PAK) 10 MG PO TABS: 10.0000 mg | ORAL_TABLET | Freq: Every day | ORAL | Status: DC

## 2013-07-09 MED ORDER — HYDROCODONE-ACETAMINOPHEN 5-325 MG PO TABS: 1.0000 | ORAL_TABLET | Freq: Four times a day (QID) | ORAL | Status: DC | PRN

## 2013-07-09 NOTE — Discharge Summary (Signed)
Physician Discharge Summary  Patient ID: [name] MRN: [id number] DOB/AGE: 1924/10/19 79 y.o.  Admit date: 07/07/2013 Discharge date: 07/09/2013  Admission Diagnoses: Sacral fracture with degenerative disc disease lumbar spine  Discharge Diagnoses:  Principal Problem:   Sacral fracture, closed   Discharged Condition: stable  Hospital Course: Patient's hospital course was essentially unremarkable. She received an MRI scan which showed the sacral fracture. Patient was started on physical therapy. Patient was felt to be safe for discharge to skilled nursing and was discharged to skilled nursing in stable condition.  Consults: None  Significant Diagnostic Studies: labs: MRI scan lumbar spine   Treatments: pain medication and physical therapy   Discharge Exam: Blood pressure 139/66, pulse 74, temperature 98.6 F (37 C), temperature source Oral, resp. rate 16, height 5\' 3"  (1.6 m), weight 62.596 kg (138 lb), SpO2 94.00%. Positive sciatic tension signs on the right with mild weakness on the right but no focal deficits.  Disposition:      Medication List    ASK your doctor about these medications       amLODipine 5 MG tablet  Commonly known as:  NORVASC  Take 5 mg by mouth daily.     aspirin EC 81 MG tablet  Take 81 mg by mouth daily.     beta carotene w/minerals tablet  Take 1 tablet by mouth daily.     cyclobenzaprine 10 MG tablet  Commonly known as:  FLEXERIL  Take 10 mg by mouth 3 (three) times daily as needed for muscle spasms.     DULoxetine 60 MG capsule  Commonly known as:  CYMBALTA  Take 60 mg by mouth daily.     ergocalciferol 50000 UNITS capsule  Commonly known as:  VITAMIN D2  Take 50,000 Units by mouth once a week.     gabapentin 100 MG capsule  Commonly known as:  NEURONTIN  Take 100 mg by mouth at bedtime.     hydrochlorothiazide 12.5 MG capsule  Commonly known as:  MICROZIDE  Take 12.5 mg by mouth daily.     HYDROcodone-acetaminophen 5-325  MG per tablet  Commonly known as:  NORCO/VICODIN  Take 1 tablet by mouth every 6 (six) hours as needed for pain.     ibuprofen 200 MG tablet  Commonly known as:  ADVIL,MOTRIN  Take 200 mg by mouth every 6 (six) hours as needed for pain.     olmesartan 20 MG tablet  Commonly known as:  BENICAR  Take 20 mg by mouth daily.     polyethylene glycol packet  Commonly known as:  MIRALAX / GLYCOLAX  Take 17 g by mouth daily.     potassium chloride SA 20 MEQ tablet  Commonly known as:  K-DUR,KLOR-CON  Take 20 mEq by mouth daily.     predniSONE 10 MG tablet  Commonly known as:  DELTASONE  Take 40 mg by mouth once.     senna 8.6 MG Tabs  Commonly known as:  SENOKOT  Take 1 tablet by mouth at bedtime as needed (constipation).           Follow-up Information   Follow up with Nadara Mustard, MD.   Contact information:   [address] [place] [address] [phone]       Signed: Nadara Mustard 07/09/2013, 6:34 AM

## 2013-07-09 NOTE — Evaluation (Signed)
Physical Therapy Evaluation Patient Details Name: [name] MRN: [id number] DOB: 16-Oct-1924 Today's Date: 07/09/2013 Time: 4098-1191 PT Time Calculation (min): 15 min  PT Assessment / Plan / Recommendation History of Present Illness  s/p fall 2 weeks ago resulting in closed sacral fx  Clinical Impression  Pt adm due to LBP and fall that occurred 2 weeks ago. Pt has closed sacral fx and LBP that attributes to problem list below. Pt resides in independent living section at [facility] and will require STSNF at [facility] when she returns to increase mobility and return to PLOF. Will benefit from skilled PT while in acute setting to maximzie functional mobility.     PT Assessment  Patient needs continued PT services    Follow Up Recommendations  SNF;Supervision/Assistance - 24 hour    Does the patient have the potential to tolerate intense rehabilitation      Barriers to Discharge        Equipment Recommendations  None recommended by PT (TBD )    Recommendations for Other Services     Frequency Min 3X/week    Precautions / Restrictions Precautions Precautions: Fall Restrictions Weight Bearing Restrictions: No   Pertinent Vitals/Pain "more than 10/10 with activity" "no pain at rest"      Mobility  Bed Mobility Bed Mobility: Supine to Sit;Rolling Right;Rolling Left Rolling Right: 6: Modified independent (Device/Increase time);With rail Rolling Left: 6: Modified independent (Device/Increase time);With rail Supine to Sit: 3: Mod assist;HOB elevated;With rails Details for Bed Mobility Assistance: pt c/o increased pain with bed mobility; required (A) to advance R LE to EOB and trunk to upright position; pt could not maintain upright position due to pain in Low back  Transfers Transfers: Sit to Stand;Stand to Sit Sit to Stand: 1: +2 Total assist;From bed;With upper extremity assist Sit to Stand: Patient Percentage: 70% Stand to Sit: To bed;With upper extremity assist;3:  Mod assist Details for Transfer Assistance: pt requries 2+ assist for sit to stand due to pain; pt able to achieve transfer with hand held (A) of two; requires max encouragement; pt had to transfer from semi sitting position directly to upright standing due to inability to sit on EOB secondary to pain   Ambulation/Gait Ambulation/Gait Assistance: 4: Min assist Ambulation Distance (Feet): 2 Feet (laterally) Assistive device: 2 person hand held assist Ambulation/Gait Assistance Details: simulated taking steps at EOB; pt has difficulty with R hip flexion due to pain; pt able to take lateral steps to [facility] with HHA and cues; pt required (A) to facilitate weightshift and advance R LE; c/o 10/10 pain with amb Gait Pattern: Step-to pattern;Decreased stride length Gait velocity: decreased lateral steps Stairs: No Wheelchair Mobility Wheelchair Mobility: No    Exercises General Exercises - Lower Extremity Ankle Circles/Pumps: AROM;Both;10 reps;Supine   PT Diagnosis: Acute pain;Difficulty walking  PT Problem List: Decreased strength;Decreased range of motion;Decreased activity tolerance;Decreased balance;Decreased mobility;Pain PT Treatment Interventions: DME instruction;Gait training;Functional mobility training;Therapeutic activities;Therapeutic exercise;Balance training;Neuromuscular re-education;Patient/family education     PT Goals(Current goals can be found in the care plan section) Acute Rehab PT Goals Patient Stated Goal: to have less pain and go back to [facility] PT Goal Formulation: With patient Time For Goal Achievement: 07/15/13 Potential to Achieve Goals: Good  Visit Information  Last PT Received On: 07/09/13 Assistance Needed: +2 History of Present Illness: s/p fall 2 weeks ago resulting in closed sacral fx       Prior Functioning  Home Living Family/patient expects to be discharged to:: Other (Comment)  Additional Comments: [facility] is where she  resides; she plans to D/C there for their skilled nursing as well Prior Function Level of Independence: Independent with assistive device(s) (used SPC PRN ) Communication Communication: No difficulties    Cognition  Cognition Arousal/Alertness: Awake/alert Behavior During Therapy: WFL for tasks assessed/performed Overall Cognitive Status: Within Functional Limits for tasks assessed    Extremity/Trunk Assessment Upper Extremity Assessment Upper Extremity Assessment: Overall WFL for tasks assessed Lower Extremity Assessment Lower Extremity Assessment: RLE deficits/detail RLE Deficits / Details: grossly 3/5; limited testing due to pain  RLE: Unable to fully assess due to pain RLE Sensation:  (c/o numbness in R foot) Cervical / Trunk Assessment Cervical / Trunk Assessment: Normal   Balance Balance Balance Assessed: Yes Static Sitting Balance Static Sitting - Balance Support: Bilateral upper extremity supported;Feet unsupported Static Sitting - Comment/# of Minutes: unable to sit in upright position due to pain   End of Session PT - End of Session Equipment Utilized During Treatment: Gait belt Activity Tolerance: Patient limited by pain Patient left: in bed;with call bell/phone within reach Nurse Communication: Mobility status  GP     Donell Sievert, Zeb [phone] 07/09/2013, 1:16 PM

## 2013-07-09 NOTE — Progress Notes (Signed)
Report called to [facility] to [employer]

## 2013-07-09 NOTE — Progress Notes (Signed)
Ok per MD for d/c back to [facility] today via EMS. Ok per Sil at [employer] and d/c summary sent to facility for review.  Patient states that she will call her family and let them know about d.c.  Ok per facility for review. Patient's nurse is aware and will call report. No further CSW needs identified. Patient is pleased with d/c arrangements.  Lorri Frederick. [place]  [phone]

## 2013-07-10 LAB — URINE CULTURE

## 2013-07-13 ENCOUNTER — Encounter: Payer: Self-pay | Admitting: Geriatric Medicine

## 2013-07-13 ENCOUNTER — Non-Acute Institutional Stay (SKILLED_NURSING_FACILITY): Payer: Medicare Other | Admitting: Geriatric Medicine

## 2013-07-13 DIAGNOSIS — S322XXA Fracture of coccyx, initial encounter for closed fracture: Secondary | ICD-10-CM

## 2013-07-13 DIAGNOSIS — S3210XA Unspecified fracture of sacrum, initial encounter for closed fracture: Secondary | ICD-10-CM

## 2013-07-13 DIAGNOSIS — IMO0002 Reserved for concepts with insufficient information to code with codable children: Secondary | ICD-10-CM

## 2013-07-13 DIAGNOSIS — I10 Essential (primary) hypertension: Secondary | ICD-10-CM

## 2013-07-13 NOTE — Assessment & Plan Note (Signed)
Daily blood pressure and pulse readings have been stable. Continue current medication

## 2013-07-13 NOTE — Assessment & Plan Note (Signed)
Evidence of mild to moderate spinal stenosis noted on MRI 07/07/2013. Pathology noted at L3-4,4- 5, L5-S1. These are chronic changes likely exacerbated by patient's fall and sacral fracture. Patient is receiving prednisone taper to help reduce inflammation.

## 2013-07-13 NOTE — Progress Notes (Signed)
Patient ID: [name], female   DOB: 06-11-1924, 79 y.o.   MRN: [id number] [facility] [phone])  Code Status:Living Will, DNR  Contact Information   Name Relation Home Work Mc[place] Daughter [phone]  [phone]   Rush,Stacie Daughter [phone]  [phone]       Chief Complaint  Patient presents with  . Sacral fracture    HPI: This is a 79 y.o. female resident of [facility], Independent Living section. This patient had a fall 06/24/2013 while visiting in [place], [place].  She got herself up initially no pain. She notes that she did hit her forehead, no loss of consciousness. She immediately noticed a hematoma on her right shin, he developed bruising of the left lateral and lateral thigh and hip on the right. The next day she developed some right hip pain did not treat herself with any medication no ice application. She drove herself home on July 6, had no pain until she got out of the car. Rt. hip pain progressively worsened over next several days. She was evaluated at the orthopedic office on July 8 with a right hip x-ray that revealed no fracture. Pain progressed to involve her lower back, she returned for orthopedic office on July 16 had x-ray of her lower back that did not reveal any fracture. Patient was admitted to Rehab sectio at [facility] 07/06/13 due to severe right lower back and buttock pain, worse than any of the pain she's had. X-rays of hip, sacrum, pelvis completed 7/17 were without evidence of acute fracture. Pt's pain worsened, also developed rt. Foot numbness. DrDuda was called, recommend Hospital evaluation with MRI. This revealed a sacral fracture along with spinal stenosis L3-4, 4-5, L5-S1. Patient was discharged back to the skilled rehabilitation section of [facility] on 7/20 with instructions for pain management to include hydrocodone, Flexeril and prednisone taper. Physical therapy also  recommended. Patient has been experiencing significant pain with any movement. Fentanyl transdermal has been added for pain management. PT has been able to work with this patient a little bit she is able to transfer only with a Hoyer lift at this time due to severe pain with sitting up.    No Known Allergies Medications Reviewed  DATA REVIEWED  Radiologic Exams:    [facility] x-ray 07/06/2013 x-ray pelvis no obvious acute fractures of the pelvis can be seen  X-ray sacrum and coccyx: No definite acute bony abnormalities sacrococcygeal region can be seen minor irregularities noted S1 and S2 level believed to be degenerative  X-ray right hip buttocks to degenerative arthritic changes right hip. No definite acute fractures the right hip  X-ray right femur: No definite acute bony abnormalities right femur can be identified   [facility] Radiology 07/07/2013 MRI lumbar spine: IMPRESSION: 1.  Nondisplaced right sacral ala fracture.  Superimposed chronic central S2 sacral fracture.  Mild presacral edema in the pelvis. 2.  No acute traumatic injury identified in the lumbar or lower thoracic spine. 3.  Multifactorial moderate spinal and lateral recess stenosis at L3-L4 and L4-L5.  Moderate multifactorial left L5 S1 foraminal stenosis.   Cardiovascular Exams:   Laboratory Studies:       Review of Systems  DATA OBTAINED: from patient, nurse,  GENERAL:   No fevers, fatigue, change in appetite or weight SKIN: No itch, rash  EYES: No eye pain, dryness or itching  No change in vision EARS: No earache, tinnitus, change in hearing NOSE: No congestion, drainage or bleeding MOUTH/THROAT: No mouth  or tooth pain  No sore throat No difficulty chewing or swallowing RESPIRATORY: No cough, wheezing, SOB CARDIAC: No chest pain, palpitations  No edema. GI: No abdominal pain  No N/V/D or constipation  No heartburn or reflux  GU: No dysuria, frequency or urgency  No change in urine volume or character   MUSCULOSKELETAL: No joint pain, swelling or stiffness  Back pain  - see HPI  NEUROLOGIC: No dizziness, fainting, headache. Numbness rt. Foot.  No change in mental status.  PSYCHIATRIC: No feelings of anxiety, depression Sleeps well.       Physical Exam Filed Vitals:   07/13/13 1459  BP: 145/67  Pulse: 74  SpO2: 94%   There is no weight on file to calculate BMI.  GENERAL APPEARANCE: Mild distress due to back pain. Appropriately groomed, normal body habitus. Alert, pleasant, conversant. SKIN: No diaphoresis rash, unusual lesions. Hematoma anterior rt. leg HEAD: Normocephalic, atraumatic EYES: Conjunctiva/lids clear.  EARS:Hearing grossly normal. NOSE: No deformity or discharge. RESPIRATORY: Breathing is even, unlabored.  MUSCULOSKELETAL: Minimal movement due to back pain. Some relief with reposition/ breathing technique NEUROLOGIC: Oriented to time, place, person.Speech clear, no tremor. PSYCHIATRIC: Mood and affect appropriate to situation  ASSESSMENT/PLAN  Sacral fracture, closed Sacral fracture found on MRI of the lumbar spine 07/07/2013. Patient has continued to have significant pain from this injury. Fentanyl transdermal patch was added earlier this week to aid with pain management. Continue to progress movement/ activity as pt tolerates.   Spinal stenosis of lumbar region Evidence of mild to moderate spinal stenosis noted on MRI 07/07/2013. Pathology noted at L3-4,4- 5, L5-S1. These are chronic changes likely exacerbated by patient's fall and sacral fracture. Patient is receiving prednisone taper to help reduce inflammation.  Essential hypertension, benign Daily blood pressure and pulse readings have been stable. Continue current medication    Follow up: As needed Lab 07/18/13 CBC, BMP  Aerion Bagdasarian T.Saleh Ulbrich, NP-C 07/13/2013

## 2013-07-13 NOTE — Assessment & Plan Note (Addendum)
Sacral fracture found on MRI of the lumbar spine 07/07/2013. Patient has continued to have significant pain from this injury. Fentanyl transdermal patch was added earlier this week to aid with pain management. Continue to progress movement/ activity as pt tolerates.

## 2013-07-19 ENCOUNTER — Encounter (HOSPITAL_COMMUNITY): Payer: Self-pay | Admitting: *Deleted

## 2013-07-19 ENCOUNTER — Non-Acute Institutional Stay (SKILLED_NURSING_FACILITY): Payer: Medicare Other | Admitting: Geriatric Medicine

## 2013-07-19 ENCOUNTER — Emergency Department (HOSPITAL_COMMUNITY): Payer: Medicare Other

## 2013-07-19 ENCOUNTER — Inpatient Hospital Stay (HOSPITAL_COMMUNITY)
Admission: EM | Admit: 2013-07-19 | Discharge: 2013-07-21 | DRG: 552 | Disposition: A | Discharge status: Skilled Nursing Facility | Payer: Medicare Other | Attending: Internal Medicine | Admitting: Internal Medicine

## 2013-07-19 ENCOUNTER — Encounter: Payer: Self-pay | Admitting: Geriatric Medicine

## 2013-07-19 DIAGNOSIS — H3530 Unspecified macular degeneration: Secondary | ICD-10-CM | POA: Diagnosis present

## 2013-07-19 DIAGNOSIS — S3210XD Unspecified fracture of sacrum, subsequent encounter for fracture with routine healing: Secondary | ICD-10-CM

## 2013-07-19 DIAGNOSIS — M545 Low back pain: Secondary | ICD-10-CM | POA: Diagnosis present

## 2013-07-19 DIAGNOSIS — M48061 Spinal stenosis, lumbar region without neurogenic claudication: Secondary | ICD-10-CM

## 2013-07-19 DIAGNOSIS — S3210XS Unspecified fracture of sacrum, sequela: Secondary | ICD-10-CM

## 2013-07-19 DIAGNOSIS — S3210XA Unspecified fracture of sacrum, initial encounter for closed fracture: Principal | ICD-10-CM | POA: Diagnosis present

## 2013-07-19 DIAGNOSIS — S322XXA Fracture of coccyx, initial encounter for closed fracture: Principal | ICD-10-CM | POA: Diagnosis present

## 2013-07-19 DIAGNOSIS — M1990 Unspecified osteoarthritis, unspecified site: Secondary | ICD-10-CM | POA: Diagnosis present

## 2013-07-19 DIAGNOSIS — I10 Essential (primary) hypertension: Secondary | ICD-10-CM

## 2013-07-19 DIAGNOSIS — N390 Urinary tract infection, site not specified: Secondary | ICD-10-CM | POA: Diagnosis present

## 2013-07-19 DIAGNOSIS — M25551 Pain in right hip: Secondary | ICD-10-CM

## 2013-07-19 DIAGNOSIS — E871 Hypo-osmolality and hyponatremia: Secondary | ICD-10-CM | POA: Diagnosis present

## 2013-07-19 DIAGNOSIS — Z96649 Presence of unspecified artificial hip joint: Secondary | ICD-10-CM

## 2013-07-19 DIAGNOSIS — IMO0001 Reserved for inherently not codable concepts without codable children: Secondary | ICD-10-CM

## 2013-07-19 DIAGNOSIS — Z79899 Other long term (current) drug therapy: Secondary | ICD-10-CM

## 2013-07-19 DIAGNOSIS — W19XXXA Unspecified fall, initial encounter: Secondary | ICD-10-CM | POA: Diagnosis present

## 2013-07-19 HISTORY — DX: Hypo-osmolality and hyponatremia: E87.1

## 2013-07-19 LAB — BASIC METABOLIC PANEL
CO2: 26 mEq/L (ref 19–32)
Calcium: 9.3 mg/dL (ref 8.4–10.5)
Glucose, Bld: 140 mg/dL — ABNORMAL HIGH (ref 70–99)
Sodium: 127 mEq/L — ABNORMAL LOW (ref 135–145)

## 2013-07-19 LAB — CBC WITH DIFFERENTIAL/PLATELET
Eosinophils Relative: 0 % (ref 0–5)
HCT: 33.7 % — ABNORMAL LOW (ref 36.0–46.0)
Hemoglobin: 11.7 g/dL — ABNORMAL LOW (ref 12.0–15.0)
Lymphocytes Relative: 13 % (ref 12–46)
Lymphs Abs: 1.1 10*3/uL (ref 0.7–4.0)
MCV: 87.5 fL (ref 78.0–100.0)
Monocytes Relative: 5 % (ref 3–12)
Platelets: 327 10*3/uL (ref 150–400)
RBC: 3.85 MIL/uL — ABNORMAL LOW (ref 3.87–5.11)
WBC: 9 10*3/uL (ref 4.0–10.5)

## 2013-07-19 LAB — URINALYSIS, ROUTINE W REFLEX MICROSCOPIC
Glucose, UA: NEGATIVE mg/dL
Hgb urine dipstick: NEGATIVE
Specific Gravity, Urine: 1.009 (ref 1.005–1.030)

## 2013-07-19 LAB — URINE MICROSCOPIC-ADD ON

## 2013-07-19 MED ORDER — SODIUM CHLORIDE 0.9 % IV BOLUS (SEPSIS)
1000.0000 mL | Freq: Once | INTRAVENOUS | Status: AC
  Administered 2013-07-19: 1000 mL via INTRAVENOUS

## 2013-07-19 NOTE — Assessment & Plan Note (Signed)
Mild hyponatremia on lab yesterday- HCTZ on hold- was scheduledfor repeat BMP 7/31

## 2013-07-19 NOTE — ED Notes (Addendum)
1910  Received report from off going [employer] fund[facility].  Pt is off the floor at MRI at this time will introduce self to the pt once back in department  2015  Pt back from MRI  2130  Pt relaxing at this time.  No change in status.  Pt able to use the bedpan.  Denies the want for anymore pain medication  2230  Spoke to nursing home and updated them of the status.  No change in status as of now as we are consulting to ortho

## 2013-07-19 NOTE — ED Notes (Signed)
[facility]rs[facility] home staff called orthopedic MD, Dr. Lajoyce Corners & was informed to go to ED. Pt denies new injuries/falls. Denies pain when laying flat, only occurs upon standing or sitting up.

## 2013-07-19 NOTE — ED Notes (Addendum)
From [facility]. Pt fell July 5th & has had low back pain since (S2 fracture). Reports pain & numbness to BLE getting worse. States unable to sit or stand due to extreme pain. Pt seen for same 07-07-13 & had MRI done.

## 2013-07-19 NOTE — Assessment & Plan Note (Signed)
Evidence of mild to moderate spinal stenosis noted on MRI 07/07/2013. Pathology noted at L3-4,4- 5, L5-S1. These are chronic changes likely exacerbated by patient's fall and sacral fracture. Patient has completes prednisone taper to help reduce inflammation. Back pain remains significant, of concern today is increasing numbness of lower extremities. Up until now she's had mild numbness in her right lower leg and foot. Numbness is now present in her left foot. May also have some mild weakness in her left leg. Patient has not demonstrated any incontinence of bowel or bladder. Discussed patient's current situation with Dr. Bevely Palmer he recommends further evaluation to include lab, repeat MRI Lumbar spine. Will arrrange non-emergent transport to [facility] ED.

## 2013-07-19 NOTE — ED Notes (Signed)
Hooked pt up to a 12 lead and shot an EKG. EKG shown to Dr. Loretha Stapler.

## 2013-07-19 NOTE — ED Notes (Signed)
Patient transported to MRI 

## 2013-07-19 NOTE — ED Provider Notes (Signed)
CSN: [id number]     Arrival date & time 07/19/13  1555 History     First MD Initiated Contact with Patient 07/19/13 1606     Chief Complaint  Patient presents with  . Back Pain   (Consider location/radiation/quality/duration/timing/severity/associated sxs/prior Treatment) Patient is a 79 y.o. female presenting with back pain.  Back Pain Location:  Gluteal region Quality: sharp. Pain severity:  Severe Onset quality:  Sudden Timing:  Intermittent Progression:  Worsening Chronicity:  New Context comment:  Fell on July 5th, sustained sacral fracture Relieved by: lying flat. Exacerbated by: sitting, standing. Associated symptoms: numbness (BLE R>L), paresthesias and tingling   Associated symptoms: no abdominal pain, no bladder incontinence, no bowel incontinence, no chest pain and no fever     Past Medical History  Diagnosis Date  . Arthritis   . Osteoporosis   . Essential hypertension, benign   . Osteoarthrosis, unspecified whether generalized or localized, unspecified site   . Other malaise and fatigue     re: stress, anxiety  . Allergic rhinitis, cause unspecified   . Osteoporosis/osteopenia increased risk   . Macular degeneration   . Venous insufficiency   . Cataract cortical, senile 2010    s/p bil extract/IOL implants   . Degenerative disk disease 06/2013  . Spinal stenosis of lumbar region 07/08/2013  . Hyponatremia 07/19/2013   Past Surgical History  Procedure Laterality Date  . Total hip arthroplasty Left 2005  . Replacement total knee Right 2010   Family History  Problem Relation Age of Onset  . Esophageal cancer Mother 28  . Breast cancer Daughter   . Thyroid disease Daughter    History  Substance Use Topics  . Smoking status: Former Smoker    Quit date: 07/07/1963  . Smokeless tobacco: Not on file  . Alcohol Use: 4.2 oz/week    7 Shots of liquor per week     Comment: drinks scotch daily.    OB History   Grav Para Term Preterm Abortions TAB SAB Ect  Mult Living                 Review of Systems  Constitutional: Negative for fever.  HENT: Negative for congestion.   Respiratory: Negative for cough and shortness of breath.   Cardiovascular: Negative for chest pain.  Gastrointestinal: Negative for nausea, vomiting, abdominal pain, diarrhea and bowel incontinence.  Genitourinary: Negative for bladder incontinence.  Musculoskeletal: Positive for back pain.  Neurological: Positive for tingling, numbness (BLE R>L) and paresthesias.  All other systems reviewed and are negative.    Allergies  Review of patient's allergies indicates no known allergies.  Home Medications   Current Outpatient Rx  Name  Route  Sig  Dispense  Refill  . amLODipine (NORVASC) 5 MG tablet   Oral   Take 5 mg by mouth daily.         Marland Kitchen aspirin EC 81 MG tablet   Oral   Take 81 mg by mouth daily.         . beta carotene w/minerals (OCUVITE) tablet   Oral   Take 1 tablet by mouth daily.         . DULoxetine (CYMBALTA) 60 MG capsule   Oral   Take 60 mg by mouth daily.         . ergocalciferol (VITAMIN D2) 50000 UNITS capsule   Oral   Take 50,000 Units by mouth once a week.         . fentaNYL (DURAGESIC -  DOSED MCG/HR) 25 MCG/HR   Transdermal   Place 1 patch onto the skin every 3 (three) days.         Marland Kitchen gabapentin (NEURONTIN) 100 MG capsule   Oral   Take 100 mg by mouth at bedtime.         . hydrochlorothiazide (MICROZIDE) 12.5 MG capsule   Oral   Take 12.5 mg by mouth daily.         Marland Kitchen HYDROcodone-acetaminophen (NORCO/VICODIN) 5-325 MG per tablet   Oral   Take 1 tablet by mouth every 6 (six) hours.         Marland Kitchen ibuprofen (ADVIL,MOTRIN) 200 MG tablet   Oral   Take 200 mg by mouth every 6 (six) hours as needed for pain.         Marland Kitchen olmesartan (BENICAR) 20 MG tablet   Oral   Take 20 mg by mouth daily.         . polyethylene glycol (MIRALAX / GLYCOLAX) packet   Oral   Take 17 g by mouth daily.         . potassium  chloride SA (K-DUR,KLOR-CON) 20 MEQ tablet   Oral   Take 20 mEq by mouth daily.         Marland Kitchen senna (SENOKOT) 8.6 MG TABS   Oral   Take 1 tablet by mouth at bedtime as needed (constipation).          BP 139/62  Pulse 81  Temp(Src) 98.6 F (37 C)  Resp 14  SpO2 93% Physical Exam  Nursing note and vitals reviewed. Constitutional: She is oriented to person, place, and time. She appears well-developed and well-nourished. No distress.  HENT:  Head: Normocephalic and atraumatic.  Mouth/Throat: Oropharynx is clear and moist.  Eyes: Conjunctivae are normal. Pupils are equal, round, and reactive to light. No scleral icterus.  Neck: Neck supple.  Cardiovascular: Normal rate, regular rhythm, normal heart sounds and intact distal pulses.   No murmur heard. Pulmonary/Chest: Effort normal and breath sounds normal. No stridor. No respiratory distress. She has no rales.  Abdominal: Soft. Bowel sounds are normal. She exhibits no distension. There is no tenderness. There is no rebound and no guarding.  Musculoskeletal: Normal range of motion.       Thoracic back: She exhibits no tenderness.       Lumbar back: She exhibits no tenderness, no bony tenderness, no swelling and no deformity.  Neurological: She is alert and oriented to person, place, and time. She has normal strength. A sensory deficit (circumferential BLE (possibly worse on anterolateral aspect of right foot).  Right leg numbness extends to ankle, left leg only in foot) is present. Gait (unable to test due to pain) abnormal.  Right foot is rotated inwards  Skin: Skin is warm and dry. No rash noted.  Psychiatric: She has a normal mood and affect. Her behavior is normal.    ED Course   Procedures (including critical care time)  All labs drawn in ED reviewed.  Mr Sacrum/si Joints Wo Contrast  07/19/2013   *RADIOLOGY REPORT*  Clinical Data:  Worsening numbness of both feet.  Evaluate sacrum for further injury.  MRI SACRUM WITHOUT  CONTRAST  Technique:  Multiplanar and multiecho pulse sequences of the sacrum to include the sacroiliac joints and the coccyx were obtained without intravenous contrast.  Comparison:  Lumbar spine MRI 07/08/2013.  Findings:  More diffuse marrow edema within the sacrum, throughout both sacral ala and newly in the S2 body (  where there is previously demonstrated ventral cortex fracture) .  There is buckling of the ventral cortex of the upper right sacral ala, with fluid in the cleft.  Although the fracture and edema extends up to the anterior sacral foramina, no displacement at these levels.  No effacement of the sacral spinal canal, which is predominately fat filled. 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  Marrow edema at the right puboacetabular junction, consistent with unhealed fracture.  Advanced right hip osteoarthritis. Left hip arthroplasty.  As characterized on recent lumbar spine MRI, there is lower lumbar degenerative disc disease with at least moderate spinal canal stenosis at L4-5 secondary to disc bulging and dorsal ligamentous/facet overgrowth.  Notable left L5-S1 foraminal narrowing, predominately due to disc material.  IMPRESSION:  1. Bilateral sacral insufficiency fracture, with horizontal component through S2.  The fracture is more extensive than on lumbar spine MRI 07/08/2013. 2.  No displacement along the sacral foramina. 3.  Acute or unhealed right puboacetabular junction fracture.   Original Report Authenticated By: Tiburcio Pea  All radiology studies independently viewed by me.     1. Sacral fracture   MDM  79 yo female with sacral fracture here with worsening pain and worsening numbness in right foot.    5:21 PM I spoke with Dr. Lajoyce Corners, pt's orthopedist, who recommended repeat MR as well as metabolic workup.  He felt that pt would likely need to be admitted for pain control and given inability to sit or ambulate.    12:00 AM MRI completed and shows worsening  sacral fracture without foraminal involvement.  I discussed this with Dr. Amie Critchley, who agreed with plan laid out by Dr. Lajoyce Corners.  Admitted to hospitalist.  Candyce Churn, MD 07/21/13 1130

## 2013-07-19 NOTE — Progress Notes (Signed)
Patient ID: [name], female   DOB: 18-Jun-1924, 79 y.o.   MRN: [id number] [facility] [phone])  Code Status: Living Will, DNR  Contact Information   Name Relation Home Work [place] Daughter [phone]  [phone]   Rush,Stacie Daughter [phone]  [phone]      Chief Complaint  Patient presents with  . Numb feet  . Sacral fracture    HPI: This is a 79 y.o. female resident of [facility], Independent Living section. This patient had a fall 06/24/2013 while visiting in [place], [place].  She got herself up initially no pain. The next day she developed some right hip pain did not treat herself with any medication no ice application. She drove herself home on July 6, had no pain until she got out of the car. Rt. hip pain progressively worsened over next several days. She was evaluated at the orthopedic office on July 8 with a right hip x-ray that revealed no fracture. Pain progressed to involve her lower back, she returned for orthopedic office on July 16 had x-ray of her lower back that did not reveal any fracture. Patient was admitted to Rehab sectio at [facility] 07/06/13 due to severe right lower back and buttock pain, worse than any of the pain she's had. X-rays of hip, sacrum, pelvis completed 7/17 were without evidence of acute fracture. Pt's pain worsened, also developed rt. Foot numbness. Dr.Duda was called, recommend Hospital evaluation with MRI. This revealed a sacral fracture along with spinal stenosis L3-4, 4-5, L5-S1. Patient was discharged back to the skilled rehabilitation section of [facility] on 7/20 with instructions for pain management to include hydrocodone, Flexeril and prednisone taper. Physical therapy also recommended.  Patient continued to experience significant pain with any movement, Fentanyl transdermal was added 7/24 for pain management. PT has been able to work with this patient, each day she was  having a bit less pain with activity.  She was able to transfer without the lift yesterday, but experienced significant pain with weight bearing. This morning, patient reports numbness in left foot (new), PT reports left leg appears a bit weak as compared to the right.     No Known Allergies Medications Reviewed - See MAR from [facility]  DATA REVIEWED  Radiologic Exams:    [facility] x-ray 07/06/2013 x-ray pelvis no obvious acute fractures of the pelvis can be seen  X-ray sacrum and coccyx: No definite acute bony abnormalities sacrococcygeal region can be seen minor irregularities noted S1 and S2 level believed to be degenerative  X-ray right hip buttocks to degenerative arthritic changes right hip. No definite acute fractures the right hip  X-ray right femur: No definite acute bony abnormalities right femur can be identified   [facility] 07/07/2013 MRI lumbar spine: IMPRESSION: 1.  Nondisplaced right sacral ala fracture.  Superimposed chronic central S2 sacral fracture.  Mild presacral edema in the pelvis. 2.  No acute traumatic injury identified in the lumbar or lower thoracic spine. 3.  Multifactorial moderate spinal and lateral recess stenosis at L3-L4 and L4-L5.  Moderate multifactorial left L5 S1 foraminal stenosis.   Laboratory Studies:    [facility] 07/18/2013 WBC 7.8, hemoglobin 11.0, hematocrit 32.8, platelets 336.  Glucose 94, BUN 23, creatinine 0.91, sodium 129, potassium 93.     Review of Systems  DATA OBTAINED: from patient, nurse,  GENERAL:   No fevers, fatigue, change in appetite or weight SKIN: No itch, rash  EYES: No eye pain, dryness or itching  No change in vision EARS: No earache, tinnitus, change in hearing NOSE: No congestion, drainage or bleeding MOUTH/THROAT: No mouth or tooth pain  No sore throat No difficulty chewing or swallowing RESPIRATORY: No cough, wheezing, SOB CARDIAC: No chest pain, palpitations  No edema. GI: No abdominal pain  No  N/V/D or constipation  No heartburn or reflux  GU: No dysuria, frequency or urgency  No change in urine volume or character  MUSCULOSKELETAL: No joint pain, swelling or stiffness  Back pain  - see HPI  NEUROLOGIC: No dizziness, fainting, headache. Numbness rt. Foot/lower leg, left foot.  No change in mental status.  PSYCHIATRIC: No feelings of anxiety, depression Sleeps well.       Physical Exam Filed Vitals:   07/19/13 1537  BP: 132/77  Pulse: 82  Temp: 97.1 F (36.2 C)  Weight: 132 lb 9.6 oz (60.147 kg)  SpO2: 95%   Body mass index is 23.49 kg/(m^2).  GENERAL APPEARANCE: Mild distress due to back pain. Appropriately groomed, normal body habitus. Alert, pleasant, conversant. SKIN: No diaphoresis rash, unusual lesions. Hematoma anterior rt. leg HEAD: Normocephalic, atraumatic EYES: Conjunctiva/lids clear.  EARS:Hearing grossly normal. NOSE: No deformity or discharge. RESPIRATORY: Breathing is even, unlabored.  MUSCULOSKELETAL: Minimal movement due to back pain. Able to perform straight leg lifts off bed with pain.  NEUROLOGIC: Oriented to time, place, person. Speech clear, no tremor. "they feel like they are asleep" PSYCHIATRIC: Mood and affect appropriate to situation  ASSESSMENT/PLAN  Sacral fracture, closed Sacral fracture found on MRI of the lumbar spine 07/07/2013. Patient has continued to have significant pain from this injury. Fentanyl transdermal patch was added last week to aid with pain management. Flexeril was stopped due to adverse effects (hallucination). General movement and mobilization is very limited due to this pain. Patient has been able to work some with physical therapy however, she experienced increased pain with minimal weight-bearing yesterday and has developed new numbness of left foot.   Spinal stenosis of lumbar region Evidence of mild to moderate spinal stenosis noted on MRI 07/07/2013. Pathology noted at L3-4,4- 5, L5-S1. These are chronic changes  likely exacerbated by patient's fall and sacral fracture. Patient has completes prednisone taper to help reduce inflammation. Back pain remains significant, of concern today is increasing numbness of lower extremities. Up until now she's had mild numbness in her right lower leg and foot. Numbness is now present in her left foot. May also have some mild weakness in her left leg. Patient has not demonstrated any incontinence of bowel or bladder. Discussed patient's current situation with Dr. Bevely Palmer he recommends further evaluation to include lab, repeat MRI Lumbar spine. Will arrrange non-emergent transport to [facility] ED.    Hyponatremia Mild hyponatremia on lab yesterday- HCTZ on hold- was scheduledfor repeat BMP 7/31  Follow up: As needed   Delorean Knutzen T.Ruthene Methvin, NP-C 07/19/2013

## 2013-07-19 NOTE — ED Notes (Signed)
Strength equal BLE, c/o numbness/tingling BLE with R>L. States pain upon sitting up & standing. Denies any pain to low back upon laying down

## 2013-07-19 NOTE — Assessment & Plan Note (Addendum)
Sacral fracture found on MRI of the lumbar spine 07/07/2013. Patient has continued to have significant pain from this injury. Fentanyl transdermal patch was added last week to aid with pain management. Flexeril was stopped due to adverse effects (hallucination). General movement and mobilization is very limited due to this pain. Patient has been able to work some with physical therapy however, she experienced increased pain with minimal weight-bearing yesterday and has developed new numbness of left foot.

## 2013-07-20 ENCOUNTER — Encounter (HOSPITAL_COMMUNITY): Payer: Self-pay | Admitting: Internal Medicine

## 2013-07-20 ENCOUNTER — Other Ambulatory Visit: Payer: Self-pay

## 2013-07-20 DIAGNOSIS — E871 Hypo-osmolality and hyponatremia: Secondary | ICD-10-CM

## 2013-07-20 DIAGNOSIS — I10 Essential (primary) hypertension: Secondary | ICD-10-CM

## 2013-07-20 DIAGNOSIS — M545 Low back pain: Secondary | ICD-10-CM | POA: Diagnosis present

## 2013-07-20 DIAGNOSIS — N390 Urinary tract infection, site not specified: Secondary | ICD-10-CM | POA: Diagnosis present

## 2013-07-20 LAB — CBC WITH DIFFERENTIAL/PLATELET
Eosinophils Absolute: 0.1 10*3/uL (ref 0.0–0.7)
Hemoglobin: 10.8 g/dL — ABNORMAL LOW (ref 12.0–15.0)
Lymphocytes Relative: 18 % (ref 12–46)
Lymphs Abs: 2 10*3/uL (ref 0.7–4.0)
MCH: 30 pg (ref 26.0–34.0)
Monocytes Relative: 13 % — ABNORMAL HIGH (ref 3–12)
Neutro Abs: 7.7 10*3/uL (ref 1.7–7.7)
Neutrophils Relative %: 68 % (ref 43–77)
RBC: 3.6 MIL/uL — ABNORMAL LOW (ref 3.87–5.11)

## 2013-07-20 LAB — BASIC METABOLIC PANEL
BUN: 21 mg/dL (ref 6–23)
Chloride: 97 mEq/L (ref 96–112)
GFR calc Af Amer: 86 mL/min — ABNORMAL LOW (ref >90)
Glucose, Bld: 93 mg/dL (ref 70–99)
Potassium: 3.8 mEq/L (ref 3.5–5.1)

## 2013-07-20 MED ORDER — HYDRALAZINE HCL 20 MG/ML IJ SOLN: 10.0000 mg | INTRAMUSCULAR | Status: DC | PRN

## 2013-07-20 MED ORDER — VITAMIN D (ERGOCALCIFEROL) 1.25 MG (50000 UNIT) PO CAPS
50000.0000 [IU] | ORAL_CAPSULE | ORAL | Status: DC
  Filled 2013-07-20 (×2): qty 1

## 2013-07-20 MED ORDER — ASPIRIN EC 81 MG PO TBEC
81.0000 mg | DELAYED_RELEASE_TABLET | Freq: Every day | ORAL | Status: DC
  Administered 2013-07-20 – 2013-07-21 (×2): 81 mg via ORAL
  Filled 2013-07-20 (×2): qty 1

## 2013-07-20 MED ORDER — ONDANSETRON HCL 4 MG PO TABS: 4.0000 mg | ORAL_TABLET | Freq: Four times a day (QID) | ORAL | Status: DC | PRN

## 2013-07-20 MED ORDER — HYDROCODONE-ACETAMINOPHEN 5-325 MG PO TABS
1.0000 | ORAL_TABLET | Freq: Four times a day (QID) | ORAL | Status: DC
  Administered 2013-07-20 – 2013-07-21 (×7): 1 via ORAL
  Filled 2013-07-20 (×7): qty 1

## 2013-07-20 MED ORDER — METHYLPREDNISOLONE SODIUM SUCC 40 MG IJ SOLR
20.0000 mg | Freq: Once | INTRAMUSCULAR | Status: AC
  Administered 2013-07-20: 20 mg via INTRAVENOUS
  Filled 2013-07-20 (×3): qty 0.5

## 2013-07-20 MED ORDER — ACETAMINOPHEN 325 MG PO TABS: 650.0000 mg | ORAL_TABLET | Freq: Four times a day (QID) | ORAL | Status: DC | PRN

## 2013-07-20 MED ORDER — FENTANYL 25 MCG/HR TD PT72: 25.0000 ug | MEDICATED_PATCH | TRANSDERMAL | Status: DC

## 2013-07-20 MED ORDER — SODIUM CHLORIDE 0.9 % IV SOLN
INTRAVENOUS | Status: AC
  Administered 2013-07-20: 02:00:00 via INTRAVENOUS

## 2013-07-20 MED ORDER — MORPHINE SULFATE 2 MG/ML IJ SOLN
1.0000 mg | INTRAMUSCULAR | Status: DC | PRN
  Administered 2013-07-20: 1 mg via INTRAVENOUS
  Filled 2013-07-20: qty 1

## 2013-07-20 MED ORDER — IRBESARTAN 150 MG PO TABS
150.0000 mg | ORAL_TABLET | Freq: Every day | ORAL | Status: DC
  Administered 2013-07-20 – 2013-07-21 (×2): 150 mg via ORAL
  Filled 2013-07-20 (×2): qty 1

## 2013-07-20 MED ORDER — ACETAMINOPHEN 650 MG RE SUPP: 650.0000 mg | Freq: Four times a day (QID) | RECTAL | Status: DC | PRN

## 2013-07-20 MED ORDER — DULOXETINE HCL 60 MG PO CPEP
60.0000 mg | ORAL_CAPSULE | Freq: Every day | ORAL | Status: DC
  Administered 2013-07-20 – 2013-07-21 (×2): 60 mg via ORAL
  Filled 2013-07-20 (×2): qty 1

## 2013-07-20 MED ORDER — MORPHINE SULFATE 4 MG/ML IJ SOLN
4.0000 mg | Freq: Once | INTRAMUSCULAR | Status: AC
  Administered 2013-07-20: 4 mg via INTRAVENOUS
  Filled 2013-07-20: qty 1

## 2013-07-20 MED ORDER — VITAMIN D (ERGOCALCIFEROL) 1.25 MG (50000 UNIT) PO CAPS: 50000.0000 [IU] | ORAL_CAPSULE | ORAL | Status: DC

## 2013-07-20 MED ORDER — AMLODIPINE BESYLATE 5 MG PO TABS
5.0000 mg | ORAL_TABLET | Freq: Every day | ORAL | Status: DC
  Administered 2013-07-20 – 2013-07-21 (×2): 5 mg via ORAL
  Filled 2013-07-20 (×2): qty 1

## 2013-07-20 MED ORDER — DEXTROSE 5 % IV SOLN
1.0000 g | INTRAVENOUS | Status: DC
  Administered 2013-07-20 – 2013-07-21 (×2): 1 g via INTRAVENOUS
  Filled 2013-07-20 (×4): qty 10

## 2013-07-20 MED ORDER — GABAPENTIN 100 MG PO CAPS
100.0000 mg | ORAL_CAPSULE | Freq: Every day | ORAL | Status: DC
  Administered 2013-07-20 (×2): 100 mg via ORAL
  Filled 2013-07-20 (×4): qty 1

## 2013-07-20 MED ORDER — ONDANSETRON HCL 4 MG/2ML IJ SOLN: 4.0000 mg | Freq: Four times a day (QID) | INTRAMUSCULAR | Status: DC | PRN

## 2013-07-20 MED ORDER — OCUVITE PO TABS
1.0000 | ORAL_TABLET | Freq: Every day | ORAL | Status: DC
  Administered 2013-07-20 – 2013-07-21 (×2): 1 via ORAL
  Filled 2013-07-20 (×2): qty 1

## 2013-07-20 MED ORDER — POLYETHYLENE GLYCOL 3350 17 G PO PACK
17.0000 g | PACK | Freq: Every day | ORAL | Status: DC
  Administered 2013-07-21: 17 g via ORAL
  Filled 2013-07-20 (×2): qty 1

## 2013-07-20 NOTE — H&P (Signed)
[name] is an 79 y.o. female.   Chief Complaint: pt fell at home 3 weeks ago Now with continued back and buttock pain Pain radiates to B legs- worse on Rt Recent MRI reveals B sacral insufficiency fracture with horizontal component through S2 TRH has requested sacroplasty. Scheduled for 8/1 HPI: HTN; osteoporosis; mac degeneration; spinal stenosis  Past Medical History  Diagnosis Date  . Arthritis   . Osteoporosis   . Essential hypertension, benign   . Osteoarthrosis, unspecified whether generalized or localized, unspecified site   . Other malaise and fatigue     re: stress, anxiety  . Allergic rhinitis, cause unspecified   . Osteoporosis/osteopenia increased risk   . Macular degeneration   . Venous insufficiency   . Cataract cortical, senile 2010    s/p bil extract/IOL implants   . Degenerative disk disease 06/2013  . Spinal stenosis of lumbar region 07/08/2013  . Hyponatremia 07/19/2013    Past Surgical History  Procedure Laterality Date  . Total hip arthroplasty Left 2005  . Replacement total knee Right 2010    Family History  Problem Relation Age of Onset  . Esophageal cancer Mother 79  . Breast cancer Daughter   . Thyroid disease Daughter    Social History:  reports that she quit smoking about 50 years ago. She does not have any smokeless tobacco history on file. She reports that she drinks about 4.2 ounces of alcohol per week. She reports that she does not use illicit drugs.  Allergies: No Known Allergies  Medications Prior to Admission  Medication Sig Dispense Refill  . amLODipine (NORVASC) 5 MG tablet Take 5 mg by mouth daily.      Marland Kitchen aspirin EC 81 MG tablet Take 81 mg by mouth daily.      . beta carotene w/minerals (OCUVITE) tablet Take 1 tablet by mouth daily.      . Calcium Carbonate-Vitamin D (CALCIUM 600 + D PO) Take 1 tablet by mouth daily.      . cyclobenzaprine (FLEXERIL) 10 MG tablet Take 10 mg by mouth 3 (three) times daily as needed for muscle  spasms.      . DULoxetine (CYMBALTA) 60 MG capsule Take 60 mg by mouth daily.      . ergocalciferol (VITAMIN D2) 50000 UNITS capsule Take 50,000 Units by mouth once a week. On saturday      . fentaNYL (DURAGESIC - DOSED MCG/HR) 25 MCG/HR Place 1 patch onto the skin every 3 (three) days.      Marland Kitchen gabapentin (NEURONTIN) 100 MG capsule Take 100 mg by mouth at bedtime.      Marland Kitchen HYDROcodone-acetaminophen (NORCO/VICODIN) 5-325 MG per tablet Take 1 tablet by mouth every 6 (six) hours. For pain      . ibuprofen (ADVIL,MOTRIN) 200 MG tablet Take 200 mg by mouth every 6 (six) hours as needed for pain.      Marland Kitchen olmesartan (BENICAR) 20 MG tablet Take 20 mg by mouth daily.      . polyethylene glycol (MIRALAX / GLYCOLAX) packet Take 17 g by mouth daily.      . potassium chloride SA (K-DUR,KLOR-CON) 20 MEQ tablet Take 20 mEq by mouth daily.      . hydrochlorothiazide (MICROZIDE) 12.5 MG capsule Take 12.5 mg by mouth daily.        Results for orders placed during the hospital encounter of 07/19/13 (from the past 48 hour(s))  CBC WITH DIFFERENTIAL     Status: Abnormal   Collection Time  07/19/13  5:43 PM      Result Value Range   WBC 9.0  4.0 - 10.5 K/uL   RBC 3.85 (*) 3.87 - 5.11 MIL/uL   Hemoglobin 11.7 (*) 12.0 - 15.0 g/dL   HCT 72.5 (*) 36.6 - 44.0 %   MCV 87.5  78.0 - 100.0 fL   MCH 30.4  26.0 - 34.0 pg   MCHC 34.7  30.0 - 36.0 g/dL   RDW 34.7  42.5 - 95.6 %   Platelets 327  150 - 400 K/uL   Neutrophils Relative % 82 (*) 43 - 77 %   Neutro Abs 7.4  1.7 - 7.7 K/uL   Lymphocytes Relative 13  12 - 46 %   Lymphs Abs 1.1  0.7 - 4.0 K/uL   Monocytes Relative 5  3 - 12 %   Monocytes Absolute 0.4  0.1 - 1.0 K/uL   Eosinophils Relative 0  0 - 5 %   Eosinophils Absolute 0.0  0.0 - 0.7 K/uL   Basophils Relative 1  0 - 1 %   Basophils Absolute 0.1  0.0 - 0.1 K/uL  BASIC METABOLIC PANEL     Status: Abnormal   Collection Time    07/19/13  5:43 PM      Result Value Range   Sodium 127 (*) 135 - 145 mEq/L    Potassium 4.5  3.5 - 5.1 mEq/L   Chloride 91 (*) 96 - 112 mEq/L   CO2 26  19 - 32 mEq/L   Glucose, Bld 140 (*) 70 - 99 mg/dL   BUN 26 (*) 6 - 23 mg/dL   Creatinine, Ser 3.87  0.50 - 1.10 mg/dL   Calcium 9.3  8.4 - 56.4 mg/dL   GFR calc non Af Amer 62 (*) >90 mL/min   GFR calc Af Amer 72 (*) >90 mL/min   Comment:            The eGFR has been calculated     using the CKD EPI equation.     This calculation has not been     validated in all clinical     situations.     eGFR's persistently     <90 mL/min signify     possible Chronic Kidney Disease.  URINALYSIS, ROUTINE W REFLEX MICROSCOPIC     Status: Abnormal   Collection Time    07/19/13  8:07 PM      Result Value Range   Color, Urine YELLOW  YELLOW   APPearance CLOUDY (*) CLEAR   Specific Gravity, Urine 1.009  1.005 - 1.030   pH 6.5  5.0 - 8.0   Glucose, UA NEGATIVE  NEGATIVE mg/dL   Hgb urine dipstick NEGATIVE  NEGATIVE   Bilirubin Urine NEGATIVE  NEGATIVE   Ketones, ur NEGATIVE  NEGATIVE mg/dL   Protein, ur NEGATIVE  NEGATIVE mg/dL   Urobilinogen, UA 0.2  0.0 - 1.0 mg/dL   Nitrite NEGATIVE  NEGATIVE   Leukocytes, UA MODERATE (*) NEGATIVE  URINE MICROSCOPIC-ADD ON     Status: Abnormal   Collection Time    07/19/13  8:07 PM      Result Value Range   Squamous Epithelial / LPF RARE  RARE   WBC, UA 11-20  <3 WBC/hpf   RBC / HPF 0-2  <3 RBC/hpf   Bacteria, UA MANY (*) RARE  BASIC METABOLIC PANEL     Status: Abnormal   Collection Time    07/20/13  5:40 AM  Result Value Range   Sodium 131 (*) 135 - 145 mEq/L   Potassium 3.8  3.5 - 5.1 mEq/L   Chloride 97  96 - 112 mEq/L   CO2 24  19 - 32 mEq/L   Glucose, Bld 93  70 - 99 mg/dL   BUN 21  6 - 23 mg/dL   Creatinine, Ser 1.61  0.50 - 1.10 mg/dL   Calcium 8.6  8.4 - 09.6 mg/dL   GFR calc non Af Amer 74 (*) >90 mL/min   GFR calc Af Amer 86 (*) >90 mL/min   Comment:            The eGFR has been calculated     using the CKD EPI equation.     This calculation has not been      validated in all clinical     situations.     eGFR's persistently     <90 mL/min signify     possible Chronic Kidney Disease.  TSH     Status: None   Collection Time    07/20/13  5:40 AM      Result Value Range   TSH 2.047  0.350 - 4.500 uIU/mL  CBC WITH DIFFERENTIAL     Status: Abnormal   Collection Time    07/20/13  5:40 AM      Result Value Range   WBC 11.3 (*) 4.0 - 10.5 K/uL   RBC 3.60 (*) 3.87 - 5.11 MIL/uL   Hemoglobin 10.8 (*) 12.0 - 15.0 g/dL   HCT 04.5 (*) 40.9 - 81.1 %   MCV 88.3  78.0 - 100.0 fL   MCH 30.0  26.0 - 34.0 pg   MCHC 34.0  30.0 - 36.0 g/dL   RDW 91.4  78.2 - 95.6 %   Platelets 301  150 - 400 K/uL   Neutrophils Relative % 68  43 - 77 %   Neutro Abs 7.7  1.7 - 7.7 K/uL   Lymphocytes Relative 18  12 - 46 %   Lymphs Abs 2.0  0.7 - 4.0 K/uL   Monocytes Relative 13 (*) 3 - 12 %   Monocytes Absolute 1.5 (*) 0.1 - 1.0 K/uL   Eosinophils Relative 1  0 - 5 %   Eosinophils Absolute 0.1  0.0 - 0.7 K/uL   Basophils Relative 0  0 - 1 %   Basophils Absolute 0.0  0.0 - 0.1 K/uL   Mr Sacrum/si Joints Wo Contrast  07/19/2013   *RADIOLOGY REPORT*  Clinical Data:  Worsening numbness of both feet.  Evaluate sacrum for further injury.  MRI SACRUM WITHOUT CONTRAST  Technique:  Multiplanar and multiecho pulse sequences of the sacrum to include the sacroiliac joints and the coccyx were obtained without intravenous contrast.  Comparison:  Lumbar spine MRI 07/08/2013.  Findings:  More diffuse marrow edema within the sacrum, throughout both sacral ala and newly in the S2 body (where there is previously demonstrated ventral cortex fracture) .  There is buckling of the ventral cortex of the upper right sacral ala, with fluid in the cleft.  Although the fracture and edema extends up to the anterior sacral foramina, no displacement at these levels.  No effacement of the sacral spinal canal, which is predominately fat filled. 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  Marrow edema at the right puboacetabular junction, consistent with unhealed fracture.  Advanced right hip osteoarthritis. Left hip arthroplasty.  As characterized on recent lumbar spine MRI, there is lower  lumbar degenerative disc disease with at least moderate spinal canal stenosis at L4-5 secondary to disc bulging and dorsal ligamentous/facet overgrowth.  Notable left L5-S1 foraminal narrowing, predominately due to disc material.  IMPRESSION:  1. Bilateral sacral insufficiency fracture, with horizontal component through S2.  The fracture is more extensive than on lumbar spine MRI 07/08/2013. 2.  No displacement along the sacral foramina. 3.  Acute or unhealed right puboacetabular junction fracture.   Original Report Authenticated By: Tiburcio Pea    Review of Systems  Constitutional: Negative for fever.  Respiratory: Negative for shortness of breath.   Gastrointestinal: Negative for nausea, vomiting and abdominal pain.  Musculoskeletal: Positive for back pain.  Neurological: Positive for weakness. Negative for headaches.  Psychiatric/Behavioral: The patient is nervous/anxious.     Blood pressure 137/65, pulse 75, temperature 97.8 F (36.6 C), temperature source Oral, resp. rate 18, height 5\' 3"  (1.6 m), weight 134 lb 7.7 oz (61 kg), SpO2 90.00%. Physical Exam  Constitutional: She is oriented to person, place, and time. She appears well-developed.  Cardiovascular: Normal rate, regular rhythm and normal heart sounds.   No murmur heard. Respiratory: Effort normal and breath sounds normal. She has no wheezes.  GI: Soft. Bowel sounds are normal. There is no tenderness.  Musculoskeletal: Normal range of motion.  Low back and buttock pain- radiates to Rt leg   Neurological: She is alert and oriented to person, place, and time.  Skin: Skin is warm and dry.  Psychiatric: She has a normal mood and affect. Her behavior is normal. Judgment and thought content normal.      Assessment/Plan B sacral fxs- painful x 3 weeks Recent MRI reveals fxs and horizontal component Pt is aware of procedure benefits and risks and agreeable to proceed I personally spoke to Dr Lajoyce Corners and pts niece- Meg PA about procedure All are agreeable to proceed Consent signed and in  Chart On Rocephin Insurance precertification underway  Thurl Boen A 07/20/2013, 3:22 PM

## 2013-07-20 NOTE — Progress Notes (Signed)
[facility] PROGRESS NOTE  Harmoni [name] NFA:[id number] DOB: May 18, 1924 DOA: 07/19/2013 PCP: Hoyle Sauer, MD  Assessment/Plan: Sacral fracture MRI repeated showing increased edema in the sacral region with increased involvement of the sacral fracture. No displacement over the sacral foramina. She has maximum pain over the area. No bowel or urinary symptoms. Also has moderate L4-L5 foraminal stenosis. Appreciate ortho recommendations. Requested IR for possible epidural injection of steroid to reduce inflammation. IR suggest that this will not improve her symptoms and recommended for sacral plasty. Request placed and would likely be done tomorrow. continue when necessary morphine and Vicodin. Continue fentanyl patch.  PT eval Bowel regimen. Monitor for any neurological symptoms. Flexeril  discontinued due to hallucinations.  Hyponatremia Likely secondary to dehydration and HCTZ which has been held. Continue hydration. Na improving in am labs   UTI: Continue rocephin. Follow cx  HTN  continue avapro  DVT prophylaxis  Diet: regular  Code Status: Full Code Family Communication: None at bedside  Disposition Plan: Return to skilled nursing facility   Consultants:  Dr Lajoyce Corners   IR ( Dr Terald Sleeper)  Procedures:  For possible sacroplasty on 8/1  Antibiotics:  IV Rocephin (7/31>>)  HPI/Subjective: Admission H&P the view. Patient reports being on movement and trying to sit up.  Objective: Filed Vitals:   07/20/13 0000 07/20/13 0124 07/20/13 0551 07/20/13 0910  BP: 130/57 163/86 124/48 137/65  Pulse: 102 113 100 75  Temp:  97.9 F (36.6 C) 97.6 F (36.4 C) 97.8 F (36.6 C)  TempSrc:  Oral Oral   Resp:  16 16 18   Height:  5\' 3"  (1.6 m)    Weight:  61 kg (134 lb 7.7 oz)    SpO2: 94% 93% 93% 90%    Intake/Output Summary (Last 24 hours) at 07/20/13 1123 Last data filed at 07/20/13 0900  Gross per 24 hour  Intake    120 ml  Output      0 ml  Net    120 ml    Filed Weights   07/20/13 0124  Weight: 61 kg (134 lb 7.7 oz)    Exam:   General: Elderly female lying in bed in no acute distress  HEENT: No pallor, moist oral mucosa  Chest: Clear to auscultation bilaterally, no added sounds  CVS: Normal S1-S2, no murmurs rub or gallop  Abdomen: Soft, nontender, nondistended, bowel sounds present  Extremities: Tender to palpation over her low back and sacrum.   Normal ROM of hip. Normal strength in legs. Normal sensations but reports numbness  CNS: AAOX3  Data Reviewed: Basic Metabolic Panel:  Recent Labs Lab 07/19/13 1743 07/20/13 0540  NA 127* 131*  K 4.5 3.8  CL 91* 97  CO2 26 24  GLUCOSE 140* 93  BUN 26* 21  CREATININE 0.83 0.76  CALCIUM 9.3 8.6   Liver Function Tests: No results found for this basename: AST, ALT, ALKPHOS, BILITOT, PROT, ALBUMIN,  in the last 168 hours No results found for this basename: LIPASE, AMYLASE,  in the last 168 hours No results found for this basename: AMMONIA,  in the last 168 hours CBC:  Recent Labs Lab 07/19/13 1743 07/20/13 0540  WBC 9.0 11.3*  NEUTROABS 7.4 7.7  HGB 11.7* 10.8*  HCT 33.7* 31.8*  MCV 87.5 88.3  PLT 327 301   Cardiac Enzymes: No results found for this basename: CKTOTAL, CKMB, CKMBINDEX, TROPONINI,  in the last 168 hours BNP (last 3 results) No results found for this basename: PROBNP,  in the  last 8760 hours CBG: No results found for this basename: GLUCAP,  in the last 168 hours  No results found for this or any previous visit (from the past 240 hour(s)).   Studies: Mr Gypsy Decant Joints Wo Contrast  07/19/2013   *RADIOLOGY REPORT*  Clinical Data:  Worsening numbness of both feet.  Evaluate sacrum for further injury.  MRI SACRUM WITHOUT CONTRAST  Technique:  Multiplanar and multiecho pulse sequences of the sacrum to include the sacroiliac joints and the coccyx were obtained without intravenous contrast.  Comparison:  Lumbar spine MRI 07/08/2013.  Findings:  More  diffuse marrow edema within the sacrum, throughout both sacral ala and newly in the S2 body (where there is previously demonstrated ventral cortex fracture) .  There is buckling of the ventral cortex of the upper right sacral ala, with fluid in the cleft.  Although the fracture and edema extends up to the anterior sacral foramina, no displacement at these levels.  No effacement of the sacral spinal canal, which is predominately fat filled. 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  Marrow edema at the right puboacetabular junction, consistent with unhealed fracture.  Advanced right hip osteoarthritis. Left hip arthroplasty.  As characterized on recent lumbar spine MRI, there is lower lumbar degenerative disc disease with at least moderate spinal canal stenosis at L4-5 secondary to disc bulging and dorsal ligamentous/facet overgrowth.  Notable left L5-S1 foraminal narrowing, predominately due to disc material.  IMPRESSION:  1. Bilateral sacral insufficiency fracture, with horizontal component through S2.  The fracture is more extensive than on lumbar spine MRI 07/08/2013. 2.  No displacement along the sacral foramina. 3.  Acute or unhealed right puboacetabular junction fracture.   Original Report Authenticated By: Tiburcio Pea    Scheduled Meds: . amLODipine  5 mg Oral Daily  . aspirin EC  81 mg Oral Daily  . beta carotene w/minerals  1 tablet Oral Daily  . cefTRIAXone (ROCEPHIN)  IV  1 g Intravenous Q24H  . DULoxetine  60 mg Oral Daily  . fentaNYL  25 mcg Transdermal Q72H  . gabapentin  100 mg Oral QHS  . HYDROcodone-acetaminophen  1 tablet Oral Q6H  . irbesartan  150 mg Oral Daily  . polyethylene glycol  17 g Oral Daily  . [START ON 07/22/2013] Vitamin D (Ergocalciferol)  50,000 Units Oral Q Sat   Continuous Infusions: . sodium chloride 100 mL/hr at 07/20/13 0153       Time spent: 25 minutes    Mitra Duling  [facility] Pager [phone] If 7PM-7AM, please  contact night-coverage at [email or web address], password [facility] 07/20/2013, 11:23 AM  LOS: 1 day

## 2013-07-20 NOTE — Progress Notes (Addendum)
Pt orientation to unit, room and routine. Information packet given to patient.  Admission INP armband ID verified with patient/family, and in place. SR up x 2, fall risk assessment complete with Patient verbalizing understanding of risks associated with falls. Pt verbalizes an understanding of how to use the call bell and to call for help before getting out of bed.  Pt defers admission assessment at this time. Per pt, "I'll do it in the morning. I'm too groggy to do anything and I just want to sleep." Pt also refuses to wear red socks. Pt understands teaching and continues to refuse.   Will cont to monitor and assist as needed.  Gilman Schmidt, RN 07/20/2013 1:20 AM

## 2013-07-20 NOTE — Evaluation (Signed)
Physical Therapy Evaluation Patient Details Name: [name] MRN: [id number] DOB: [birth year] Today's Date: 07/20/2013 Time: 5284-1324 PT Time Calculation (min): 37 min  PT Assessment / Plan / Recommendation History of Present Illness  Pt with s/p fall July 5th, 2014 and recently d/c from [facility] due to uncontrolled pain from sacral fx due to fall.  Pt now readmitted for uncontroll back pain with bilateral LE numbness/tingling right > left upon sitting and standing.  Pt with no pain in supine position.  Pt also admitted for UTI and HTN.   Clinical Impression  Pt very limited due to pain however able use bed to assist with overall mobility.  Pt was left in bed in semi chair position.  Pt unable to tolerate sitting upright due to pain.  Pt will benefit from acute PT services to improve overall mobility and prepare for safe d/c to next venue.    PT Assessment  Patient needs continued PT services    Follow Up Recommendations  SNF;Supervision/Assistance - 24 hour    Does the patient have the potential to tolerate intense rehabilitation      Barriers to Discharge        Equipment Recommendations  None recommended by PT    Recommendations for Other Services     Frequency Min 3X/week    Precautions / Restrictions Precautions Precautions: Fall Restrictions Weight Bearing Restrictions: No   Pertinent Vitals/Pain 9/10 back pain      Mobility  Bed Mobility Bed Mobility: Rolling Right;Rolling Left;Right Sidelying to Sit;Sit to Supine Rolling Right: 6: Modified independent (Device/Increase time);With rail Rolling Left: 6: Modified independent (Device/Increase time);With rail Right Sidelying to Sit: 3: Mod assist;HOB elevated;With rails Sit to Supine: 3: Mod assist;HOB elevated Details for Bed Mobility Assistance: Pt able to roll with heavy use of rail with Mod (I).  Pt needed increase assistance to attempt to sit EOB.  Therapist used HOB to slowly raise trunk and attempted to use pad to  scoot hips to EOB.  Pt unable to tolerate complete upright position and rested trunk on HOB with holding onto rail due to 9/10 back pain.  Transfers Transfers: Not assessed Ambulation/Gait Ambulation/Gait Assistance: Not tested (comment)    Exercises     PT Diagnosis: Acute pain;Difficulty walking  PT Problem List: Decreased strength;Decreased range of motion;Decreased activity tolerance;Decreased balance;Decreased mobility;Pain PT Treatment Interventions: DME instruction;Gait training;Functional mobility training;Therapeutic activities;Therapeutic exercise;Balance training;Neuromuscular re-education;Patient/family education     PT Goals(Current goals can be found in the care plan section) Acute Rehab PT Goals Patient Stated Goal: to have less pain and go back to [facility] PT Goal Formulation: With patient Time For Goal Achievement: 08/03/13 Potential to Achieve Goals: Good  Visit Information  Last PT Received On: 07/20/13 Assistance Needed: +2 (standing) History of Present Illness: Pt with s/p fall July 5th, 2014 and recently d/c from [facility] due to uncontrolled pain from sacral fx due to fall.  Pt now readmitted for uncontroll back pain with bilateral LE numbness/tingling right > left upon sitting and standing.  Pt with no pain in supine position.  Pt also admitted for UTI and HTN.        Prior Functioning  Home Living Family/patient expects to be discharged to:: Other (Comment) Additional Comments: [facility] is where she resides; she plans to D/C there for their skilled nursing as well Prior Function Level of Independence: Independent with assistive device(s) (prior to fall; progressively decrease in mobiltiy) Comments: Pt has not stand in 5 days due  to increasing pain. Communication Communication: No difficulties    Cognition  Cognition Arousal/Alertness: Awake/alert Behavior During Therapy: WFL for tasks assessed/performed Overall Cognitive Status:  Within Functional Limits for tasks assessed    Extremity/Trunk Assessment Upper Extremity Assessment Upper Extremity Assessment: Overall WFL for tasks assessed Lower Extremity Assessment Lower Extremity Assessment: Generalized weakness RLE Deficits / Details: grossly 3/5; limited testing due to pain  RLE: Unable to fully assess due to pain (per MD chart pt with decrease strength peronus brevis  ) RLE Sensation:  (c/o numbness in right foot) Cervical / Trunk Assessment Cervical / Trunk Assessment: Normal   Balance Balance Balance Assessed: Yes Static Sitting Balance Static Sitting - Balance Support: Bilateral upper extremity supported;Feet unsupported Static Sitting - Level of Assistance: 4: Min assist;3: Mod assist Static Sitting - Comment/# of Minutes: Pt unable to sit completely upright and rested trunk on HOB at ~60 degrees with hands holding onto rail due to pain.   End of Session PT - End of Session Activity Tolerance: Patient limited by pain Patient left: in bed;with call bell/phone within reach Nurse Communication: Mobility status  GP     Karman Biswell 07/20/2013, 10:22 AM   Jake Shark, PT DPT [phone]

## 2013-07-20 NOTE — H&P (Signed)
[facility] History and Physical  [name] ZOX:[id number] DOB: [birth year] DOA: 07/19/2013  Referring physician: ER physician. PCP: Hoyle Sauer, MD  Specialists: Dr. Lajoyce Corners. Orthopedic surgeon.  Chief Complaint: Worsening of low back pain.  HPI: [name] is a 79 y.o. female who was recently diagnosed with sacral fracture and presently in rehabilitation was brought to the ER after worsening low-back pain with left foot numbness. Patient has had a recent fall and recent MRI had shown sacral fracture and has been followed up by Dr. Lajoyce Corners. Patient was recently placed on steroid taper for low back pain with lumbar stenosis. Due to further worsening of the pain and inability to ambulate with numbness of the left foot patient was brought to the ER and as per the request of orthopedic surgery repeat MRI was done which showed the same fracture which is more extensive than recent MRI and as per Dr. Magnus Ivan on-call orthopedic surgeon who discussed with ER physician and they have requested admission for further management pain. Patient otherwise is nonfocal does not have any urinary incontinence or bowel incontinence and has good reflexes. In addition patient is found to have UTI and hyponatremia. Patient was found to be hyponatremic in her rehabilitation and HCTZ was discontinued. Patient otherwise denies any chest pain shortness of breath or any fever chills.  Review of Systems: As presented in the history of presenting illness, rest negative.  Past Medical History  Diagnosis Date  . Arthritis   . Osteoporosis   . Essential hypertension, benign   . Osteoarthrosis, unspecified whether generalized or localized, unspecified site   . Other malaise and fatigue     re: stress, anxiety  . Allergic rhinitis, cause unspecified   . Osteoporosis/osteopenia increased risk   . Macular degeneration   . Venous insufficiency   . Cataract cortical, senile 2010    s/p bil extract/IOL implants   .  Degenerative disk disease 06/2013  . Spinal stenosis of lumbar region 07/08/2013  . Hyponatremia 07/19/2013   Past Surgical History  Procedure Laterality Date  . Total hip arthroplasty Left 2005  . Replacement total knee Right 2010   Social History:  reports that she quit smoking about 50 years ago. She does not have any smokeless tobacco history on file. She reports that she drinks about 4.2 ounces of alcohol per week. She reports that she does not use illicit drugs. Home. where does patient live-- Not sure. Can patient participate in ADLs?  No Known Allergies  Family History  Problem Relation Age of Onset  . Esophageal cancer Mother 43  . Breast cancer Daughter   . Thyroid disease Daughter       Prior to Admission medications   Medication Sig Start Date End Date Taking? Authorizing Provider  amLODipine (NORVASC) 5 MG tablet Take 5 mg by mouth daily.   Yes Historical Provider, MD  aspirin EC 81 MG tablet Take 81 mg by mouth daily.   Yes Historical Provider, MD  beta carotene w/minerals (OCUVITE) tablet Take 1 tablet by mouth daily.   Yes Historical Provider, MD  Calcium Carbonate-Vitamin D (CALCIUM 600 + D PO) Take 1 tablet by mouth daily.   Yes Historical Provider, MD  cyclobenzaprine (FLEXERIL) 10 MG tablet Take 10 mg by mouth 3 (three) times daily as needed for muscle spasms.   Yes Historical Provider, MD  DULoxetine (CYMBALTA) 60 MG capsule Take 60 mg by mouth daily.   Yes Historical Provider, MD  ergocalciferol (VITAMIN D2) 50000 UNITS capsule Take 50,000  Units by mouth once a week. On saturday   Yes Historical Provider, MD  fentaNYL (DURAGESIC - DOSED MCG/HR) 25 MCG/HR Place 1 patch onto the skin every 3 (three) days.   Yes Historical Provider, MD  gabapentin (NEURONTIN) 100 MG capsule Take 100 mg by mouth at bedtime.   Yes Historical Provider, MD  HYDROcodone-acetaminophen (NORCO/VICODIN) 5-325 MG per tablet Take 1 tablet by mouth every 6 (six) hours. For pain 07/09/13  Yes  Claudette Royston Sinner, NP  ibuprofen (ADVIL,MOTRIN) 200 MG tablet Take 200 mg by mouth every 6 (six) hours as needed for pain.   Yes Historical Provider, MD  olmesartan (BENICAR) 20 MG tablet Take 20 mg by mouth daily.   Yes Historical Provider, MD  polyethylene glycol (MIRALAX / GLYCOLAX) packet Take 17 g by mouth daily.   Yes Historical Provider, MD  potassium chloride SA (K-DUR,KLOR-CON) 20 MEQ tablet Take 20 mEq by mouth daily.   Yes Historical Provider, MD  hydrochlorothiazide (MICROZIDE) 12.5 MG capsule Take 12.5 mg by mouth daily.    Historical Provider, MD   Physical Exam: Filed Vitals:   07/19/13 2300 07/19/13 2315 07/20/13 0000 07/20/13 0124  BP: 138/80 153/50 130/57 163/86  Pulse: 114 89 102 113  Temp:    97.9 F (36.6 C)  TempSrc:    Oral  Resp:    16  Height:    5\' 3"  (1.6 m)  Weight:    61 kg (134 lb 7.7 oz)  SpO2: 90% 89% 94% 93%     General:  Well-developed well-nourished.  Eyes: Anicteric no pallor.  ENT: No discharge from the ears eyes nose mouth.  Neck: No mass felt.  Cardiovascular: S1-S2 heard.  Respiratory: No rhonchi or crepitations.  Abdomen: Soft nontender bowel sounds present.  Skin: No rash.  Musculoskeletal: No edema.  Psychiatric: Appears normal.  Neurologic: Alert awake oriented to time place and person. Moves all extremities.  Labs on Admission:  Basic Metabolic Panel:  Recent Labs Lab 07/19/13 1743  NA 127*  K 4.5  CL 91*  CO2 26  GLUCOSE 140*  BUN 26*  CREATININE 0.83  CALCIUM 9.3   Liver Function Tests: No results found for this basename: AST, ALT, ALKPHOS, BILITOT, PROT, ALBUMIN,  in the last 168 hours No results found for this basename: LIPASE, AMYLASE,  in the last 168 hours No results found for this basename: AMMONIA,  in the last 168 hours CBC:  Recent Labs Lab 07/19/13 1743  WBC 9.0  NEUTROABS 7.4  HGB 11.7*  HCT 33.7*  MCV 87.5  PLT 327   Cardiac Enzymes: No results found for this basename: CKTOTAL, CKMB,  CKMBINDEX, TROPONINI,  in the last 168 hours  BNP (last 3 results) No results found for this basename: PROBNP,  in the last 8760 hours CBG: No results found for this basename: GLUCAP,  in the last 168 hours  Radiological Exams on Admission: Mr Sacrum/si Joints Wo Contrast  07/19/2013   *RADIOLOGY REPORT*  Clinical Data:  Worsening numbness of both feet.  Evaluate sacrum for further injury.  MRI SACRUM WITHOUT CONTRAST  Technique:  Multiplanar and multiecho pulse sequences of the sacrum to include the sacroiliac joints and the coccyx were obtained without intravenous contrast.  Comparison:  Lumbar spine MRI 07/08/2013.  Findings:  More diffuse marrow edema within the sacrum, throughout both sacral ala and newly in the S2 body (where there is previously demonstrated ventral cortex fracture) .  There is buckling of the ventral cortex of the  upper right sacral ala, with fluid in the cleft.  Although the fracture and edema extends up to the anterior sacral foramina, no displacement at these levels.  No effacement of the sacral spinal canal, which is predominately fat filled. 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  Marrow edema at the right puboacetabular junction, consistent with unhealed fracture.  Advanced right hip osteoarthritis. Left hip arthroplasty.  As characterized on recent lumbar spine MRI, there is lower lumbar degenerative disc disease with at least moderate spinal canal stenosis at L4-5 secondary to disc bulging and dorsal ligamentous/facet overgrowth.  Notable left L5-S1 foraminal narrowing, predominately due to disc material.  IMPRESSION:  1. Bilateral sacral insufficiency fracture, with horizontal component through S2.  The fracture is more extensive than on lumbar spine MRI 07/08/2013. 2.  No displacement along the sacral foramina. 3.  Acute or unhealed right puboacetabular junction fracture.   Original Report Authenticated By: Tiburcio Pea      Assessment/Plan Principal Problem:   Low back pain Active Problems:   Essential hypertension, benign   Hyponatremia   UTI (lower urinary tract infection)   1. Low-back pain with sacral fracture and lumbar stenosis - I have ordered one dose of IV steroids along with when necessary pain relief medications and continue with her Duragesic patch. Patient's Flexeril was discontinued due to hallucination. Further recommendations per orthopedics. 2. Hyponatremia - probably secondary to dehydration and HCTZ. Continue with gentle patient. Discontinue HCTZ. 3. UTI - follow urine cultures. Ceftriaxone. 4. Hypertension - when necessary IV hydralazine for systolic blood pressure more than 160 and continue present medication except for HCTZ.    Code Status: Full code.  Family Communication: None.  Disposition Plan: Admit to inpatient.    KAKRAKANDY,ARSHAD N. [facility] Pager [phone].  If 7PM-7AM, please contact night-coverage [email or web address] Password [email or web address] 07/20/2013, 1:40 AM

## 2013-07-20 NOTE — Consult Note (Addendum)
  Patient is seen in followup for her sacral fracture. Review of the MRI scan shows increased edema in the sacral as to region. This is consistent with exact area of her lower back pain. It does seem that she has increased edema with increased involvement of the sacral fracture. Examination of her lower extremities her sensation is intact except for some slight numbness in the superficial peroneal nerve distribution on the right foot. Patient also has weakness of eversion with weakness of the peroneus brevis. Patient states that she does not have the ability to get to a sitting position do to her sacral pain.  While there is no significant right-sided disc pathology at L4-5 the involvement of the S2 nerve root may be the main cause of the peroneus brevis weakness and numbness in the superficial peroneal nerve distribution.  I recommend checking with radiology to see if an epidural steroid injection would be a possibility to decrease edema on the nerve roots and see if this could resolve some of her pain symptoms. Current narcotic pain medicine does not seem to be helping.  SPEP and UPEP ordered.

## 2013-07-21 DIAGNOSIS — M25559 Pain in unspecified hip: Secondary | ICD-10-CM

## 2013-07-21 DIAGNOSIS — IMO0002 Reserved for concepts with insufficient information to code with codable children: Secondary | ICD-10-CM

## 2013-07-21 LAB — PROTEIN, URINE, 24 HOUR: Protein, Urine: <4 mg/dL

## 2013-07-21 LAB — URINE CULTURE: Colony Count: >=100000

## 2013-07-21 LAB — PROTIME-INR
INR: 1.01 (ref 0.00–1.49)
Prothrombin Time: 13.1 seconds (ref 11.6–15.2)

## 2013-07-21 LAB — BASIC METABOLIC PANEL
Calcium: 8.6 mg/dL (ref 8.4–10.5)
GFR calc Af Amer: >90 mL/min (ref >90)
GFR calc non Af Amer: 78 mL/min — ABNORMAL LOW (ref >90)
Potassium: 3.7 mEq/L (ref 3.5–5.1)
Sodium: 133 mEq/L — ABNORMAL LOW (ref 135–145)

## 2013-07-21 LAB — CBC
MCHC: 34.2 g/dL (ref 30.0–36.0)
RDW: 13.4 % (ref 11.5–15.5)

## 2013-07-21 LAB — APTT: aPTT: 34 seconds (ref 24–37)

## 2013-07-21 MED ORDER — HYDROCODONE-ACETAMINOPHEN 5-325 MG PO TABS: 1.0000 | ORAL_TABLET | ORAL | Status: DC | PRN

## 2013-07-21 MED ORDER — TRAMADOL-ACETAMINOPHEN 37.5-325 MG PO TABS: 1.0000 | ORAL_TABLET | Freq: Four times a day (QID) | ORAL | Status: DC | PRN

## 2013-07-21 MED ORDER — CIPROFLOXACIN HCL 500 MG PO TABS: 500.0000 mg | ORAL_TABLET | Freq: Two times a day (BID) | ORAL | Status: DC

## 2013-07-21 MED ORDER — AMLODIPINE BESYLATE 10 MG PO TABS: 10.0000 mg | ORAL_TABLET | Freq: Every day | ORAL | Status: AC

## 2013-07-21 NOTE — Discharge Summary (Signed)
Physician Discharge Summary  [name] BJY:[id number] DOB: [birth year] DOA: 07/19/2013  PCP: Hoyle Sauer, MD  Admit date: 07/19/2013 Discharge date: 07/21/2013  Time spent: 40 minutes  Recommendations for Outpatient Follow-up:  1. Return to [facility] for rehab 2. Patient will be called by intervention radiology before 8/4 to schedule her sacroplasty on 8/5 3. Please follow urine culture and sensitivity  Discharge Diagnoses:  Principal Problem:   Sacral fracture, closed  Active Problems:   Essential hypertension, benign   Hyponatremia   Low back pain   UTI (lower urinary tract infection)   Discharge Condition: fair  Diet recommendation: regular  Filed Weights   07/20/13 0124 07/20/13 2040  Weight: 61 kg (134 lb 7.7 oz) 63.6 kg (140 lb 3.4 oz)    History of present illness:  Please refer to admission H&P for details, but in brief, 79 y.o. female who was recently diagnosed with sacral fracture and presently in rehabilitation was brought to the ER after worsening low-back pain with left foot numbness. Patient has had a recent fall and recent MRI had shown sacral fracture and was being  followed by Dr. Lajoyce Corners. Patient was recently placed on steroid taper for low back pain with lumbar stenosis. Due to further worsening of the pain and inability to ambulate with numbness of the left foot patient was brought to the ER and as per the request of orthopedic surgery repeat MRI was done which showed the same fracture which is more extensive than recent MRI . Patient otherwise does not have any urinary incontinence or bowel incontinence and has good reflexes. In addition patient was  found to have UTI and hyponatremia. Patient was found to be hyponatremic in her rehabilitation and HCTZ was discontinued. Patient  denied any chest pain shortness of breath or any fever chills.  Hospital Course:  Sacral fracture  -MRI repeated showing increased edema in the sacral region with increased  involvement of the sacral fracture. No displacement over the sacral foramina. She has maximum pain over the area. No bowel or urinary symptoms. Also has moderate L4-L5 foraminal stenosis.  -Appreciate ortho recommendations. Requested IR for possible epidural injection of steroid to reduce inflammation. IR suggest that this will not improve her symptoms and recommended for sacroplasty.  Given her UTI , recommend to postpone it for next week until it clears. Given prn morphine and Vicodin. Continue fentanyl patch.  -PT evaluated the patient and today she was able to sit up to the bedside commode and feels that her pain has slightly improved compared to yesterday. She [facility] to [facility] to [facility] and feels she would be comfortable with the skilled nursing available there. Patient would be called by intervention radiology by Monday 8/4 to schedule her sacrplasty on 8/5 . Continue Bowel regimen.   Flexeril discontinued due to hallucinations.   Hyponatremia  Likely secondary to dehydration and HCTZ which was felt to. Sodium improved with hydration  UTI:  Placed on Rocephin. Also is growing Escherichia coli. Pending sensitivity. We'll discharge her on ciprofloxacin to complete a five-day course. Sensitivity needs to be followed as outpatient  HTN  continue avapro . Will d/c HCTZ given hyponatremia. Increase amlodipine to 10 mg   Diet: regular  Code Status: DO NOT RESUSCITATE Family Communication: None at bedside  Disposition Plan: Return to skilled nursing facility and follow up next week for sacroplasty  Consultants:  Dr Lajoyce Corners  IR ( Dr Terald Sleeper)   Procedures:  None  Antibiotics:  IV Rocephin (7/31>>)  Discharge Exam: Filed Vitals:   07/20/13 2040 07/21/13 0459 07/21/13 0902 07/21/13 1319  BP: 151/64 151/66 177/77 135/67  Pulse: 84 89 87 109  Temp: 97.9 F (36.6 C) 97.5 F (36.4 C) 98.4 F (36.9 C) 98.1 F (36.7 C)  TempSrc: Oral Oral    Resp: 16 16 18 18   Height:       Weight: 63.6 kg (140 lb 3.4 oz)     SpO2: 97% 95% 97% 95%    General: Elderly female lying in bed in no acute distress  HEENT: No pallor, moist oral mucosa  Chest: Clear to auscultation bilaterally, no added sounds  CVS: Normal S1-S2, no murmurs rub or gallop  Abdomen: Soft, nontender, nondistended, bowel sounds present  Extremities: Tender to palpation over her low back and sacrum. Normal ROM of hip. Normal strength in legs. Normal sensations but reports numbness  CNS: AAOX3   Discharge Instructions     Medication List    STOP taking these medications       ibuprofen 200 MG tablet  Commonly known as:  ADVIL,MOTRIN       hydrochlorothiazide 12.5 MG capsule  Commonly known as:  MICROZIDE  Take 12.5 mg by mouth daily.    TAKE these medications       amLODipine 10 MG tablet  Commonly known as:  NORVASC  Take 10 mg by mouth daily.     aspirin EC 81 MG tablet  Take 81 mg by mouth daily.     beta carotene w/minerals tablet  Take 1 tablet by mouth daily.     CALCIUM 600 + D PO  Take 1 tablet by mouth daily.     cyclobenzaprine 10 MG tablet  Commonly known as:  FLEXERIL  Take 10 mg by mouth 3 (three) times daily as needed for muscle spasms.     DULoxetine 60 MG capsule  Commonly known as:  CYMBALTA  Take 60 mg by mouth daily.     ergocalciferol 50000 UNITS capsule  Commonly known as:  VITAMIN D2  Take 50,000 Units by mouth once a week. On saturday     fentaNYL 25 MCG/HR  Commonly known as:  DURAGESIC - dosed mcg/hr  Place 1 patch onto the skin every 3 (three) days.     gabapentin 100 MG capsule  Commonly known as:  NEURONTIN  Take 100 mg by mouth at bedtime.              HYDROcodone-acetaminophen 5-325 MG per tablet  Commonly known as:  NORCO/VICODIN  Take 1 tablet by mouth every 4 (four) hours as needed for pain. For pain     olmesartan 20 MG tablet  Commonly known as:  BENICAR  Take 20 mg by mouth daily.     polyethylene glycol packet  Commonly  known as:  MIRALAX / GLYCOLAX  Take 17 g by mouth daily.     potassium chloride SA 20 MEQ tablet  Commonly known as:  K-DUR,KLOR-CON  Take 20 mEq by mouth daily.     traMADol-acetaminophen 37.5-325 MG per tablet  Commonly known as:  ULTRACET  Take 1 tablet by mouth every 6 (six) hours as needed for pain.       Tab ciprofloxacin 500 mg bid x 4 days   No Known Allergies     Follow-up Information   Follow up with Hoyle Sauer, MD In 1 week.   Contact information:   [address] [facility] [place] [address] [phone]  The results of significant diagnostics from this hospitalization (including imaging, microbiology, ancillary and laboratory) are listed below for reference.    Significant Diagnostic Studies: Mr Lumbar Spine Wo Contrast  07/08/2013   *RADIOLOGY REPORT*  Clinical Data: 79 year old female status post fall.  Pain with right lower extremity numbness.  MRI LUMBAR SPINE WITHOUT CONTRAST  Technique:  Multiplanar and multiecho pulse sequences of the lumbar spine were obtained without intravenous contrast.  Comparison: None.  Findings: Presacral edema evident on sagittal STIR imaging.  There is a central S2 sacral fracture, but is chronic.  There is more subtle marrow edema throughout the right sacral ala (series 8 image 14) and curvilinear decreased T1 signal in this area suggests a minimally-displaced fracture (series 7 image 32).  Lumbar segmentation appears to be normal and will be designated such for the purposes of this report.  Normal lumbar vertebral height and alignment.  No lumbar marrow edema or acute osseous abnormality.  Likewise visualized lower thoracic levels appear intact.   Visualized lower thoracic spinal cord is normal with conus medularis at L1.  Negative visualized abdominal viscera.  Posterior paraspinal soft tissues are normal.  T9-T10:  Grossly negative.  T10-T11:  Negative.  T11-T12:  Negative.  T12-L1:  Negative.   L1-L2:  Negative.  L2-L3:  Mild disc bulge.  Mild facet hypertrophy.  Trace facet joint fluid.  No stenosis.  L3-L4:  Mild circumferential disc bulge.  Moderate to severe facet and moderate ligament flavum hypertrophy.  Right greater than left facet joint fluid.  Mild to moderate spinal stenosis and mild bilateral L3 foraminal stenosis.  L4-L5:  Moderate circumferential disc bulge.  Severe facet and ligament flavum hypertrophy, worse on the right.  Right greater than left facet joint fluid.  Moderate spinal and bilateral lateral recess stenosis.  Mild bilateral L4 foraminal stenosis.  L5-S1:  Mild to moderate left foraminal disc osteophyte complex. Epidural lipomatosis.  Moderate facet hypertrophy. Moderate left L5 foraminal stenosis.  Right S2 sacral Tarlov cysts.  IMPRESSION: 1.  Nondisplaced right sacral ala fracture.  Superimposed chronic central S2 sacral fracture.  Mild presacral edema in the pelvis. 2.  No acute traumatic injury identified in the lumbar or lower thoracic spine. 3.  Multifactorial moderate spinal and lateral recess stenosis at L3-L4 and L4-L5.  Moderate multifactorial left L5 S1 foraminal stenosis.   Original Report Authenticated By: Erskine Speed, M.D.   Mr Sacrum/si Joints Wo Contrast  07/19/2013   *RADIOLOGY REPORT*  Clinical Data:  Worsening numbness of both feet.  Evaluate sacrum for further injury.  MRI SACRUM WITHOUT CONTRAST  Technique:  Multiplanar and multiecho pulse sequences of the sacrum to include the sacroiliac joints and the coccyx were obtained without intravenous contrast.  Comparison:  Lumbar spine MRI 07/08/2013.  Findings:  More diffuse marrow edema within the sacrum, throughout both sacral ala and newly in the S2 body (where there is previously demonstrated ventral cortex fracture) .  There is buckling of the ventral cortex of the upper right sacral ala, with fluid in the cleft.  Although the fracture and edema extends up to the anterior sacral foramina, no displacement  at these levels.  No effacement of the sacral spinal canal, which is predominately fat filled. 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  Marrow edema at the right puboacetabular junction, consistent with unhealed fracture.  Advanced right hip osteoarthritis. Left hip arthroplasty.  As characterized on recent lumbar spine MRI, there is lower lumbar degenerative disc disease with at least  moderate spinal canal stenosis at L4-5 secondary to disc bulging and dorsal ligamentous/facet overgrowth.  Notable left L5-S1 foraminal narrowing, predominately due to disc material.  IMPRESSION:  1. Bilateral sacral insufficiency fracture, with horizontal component through S2.  The fracture is more extensive than on lumbar spine MRI 07/08/2013. 2.  No displacement along the sacral foramina. 3.  Acute or unhealed right puboacetabular junction fracture.   Original Report Authenticated By: Tiburcio Pea    Microbiology: Recent Results (from the past 240 hour(s))  URINE CULTURE     Status: None   Collection Time    07/19/13  8:07 PM      Result Value Range Status   Specimen Description URINE, CLEAN CATCH   Final   Special Requests CX ADDED AT 2113 ON [id number]   Final   Culture  Setup Time 07/19/2013 23:05   Final   Colony Count >=100,000 COLONIES/ML   Final   Culture ESCHERICHIA COLI   Final   Report Status PENDING   Incomplete     Labs: Basic Metabolic Panel:  Recent Labs Lab 07/19/13 1743 07/20/13 0540 07/21/13 0520  NA 127* 131* 133*  K 4.5 3.8 3.7  CL 91* 97 99  CO2 26 24 24   GLUCOSE 140* 93 93  BUN 26* 21 15  CREATININE 0.83 0.76 0.66  CALCIUM 9.3 8.6 8.6   Liver Function Tests: No results found for this basename: AST, ALT, ALKPHOS, BILITOT, PROT, ALBUMIN,  in the last 168 hours No results found for this basename: LIPASE, AMYLASE,  in the last 168 hours No results found for this basename: AMMONIA,  in the last 168 hours CBC:  Recent Labs Lab 07/19/13 1743 07/20/13 0540  07/21/13 0520  WBC 9.0 11.3* 13.8*  NEUTROABS 7.4 7.7  --   HGB 11.7* 10.8* 10.7*  HCT 33.7* 31.8* 31.3*  MCV 87.5 88.3 88.7  PLT 327 301 308   Cardiac Enzymes: No results found for this basename: CKTOTAL, CKMB, CKMBINDEX, TROPONINI,  in the last 168 hours BNP: BNP (last 3 results) No results found for this basename: PROBNP,  in the last 8760 hours CBG: No results found for this basename: GLUCAP,  in the last 168 hours     Signed:  Elmarie Devlin  [facility] 07/21/2013, 1:59 PM

## 2013-07-21 NOTE — Clinical Social Work Psychosocial (Signed)
Clinical Social Work Department BRIEF PSYCHOSOCIAL ASSESSMENT AND DISCHARGE NOTE 07/21/2013  Patient:  [name], [name]     Account Number:  [id number]     Admit date:  07/19/2013  Clinical Social Worker:  Delmer Islam  Date/Time:  07/21/2013 05:07 AM  Referred by:  Physician  Date Referred:  07/21/2013 Referred for  SNF Placement   Other Referral:   Interview type:  Other - See comment Other interview type:   CSW talked with staff at [facility] [phone].    PSYCHOSOCIAL DATA Living Status:  FACILITY Admitted from facility:  [facility] Level of care:  Skilled Nursing Facility Primary support name:   Primary support relationship to patient:   Degree of support available:    CURRENT CONCERNS Current Concerns  Post-Acute Placement   Other Concerns:    SOCIAL WORK ASSESSMENT / PLAN Patient medically stable for discharge. Admissions staff at [facility] skilled facility contacted and dishcarge information forwarded to them. Patient also aware that she is being discharged today. Patient originally from [facility] but was at the skilled facility for rehab prior to coming to hospital.   Assessment/plan status:  No Further Intervention Required Other assessment/ plan:   Information/referral to community resources:    PATIENT'S/FAMILY'S RESPONSE TO PLAN OF CARE: DISCHARGE NOTE: Patient discharged back to [facility] to continue rehab. CSW facilitated transport via ambulance.

## 2013-07-21 NOTE — Progress Notes (Signed)
Discussed with Dr. Corliss Skains, insurance precertification still pending. WBC elevated today 13.8 with (+) urine cx. We will continue to monitor patient's chart and recheck Monday. Procedure SP tentative for Tuesday, august 5th to coordinate with Dr. Corliss Skains schedule. Attending has been informed and aware. Will place pre-orders at a closer date.

## 2013-07-21 NOTE — Progress Notes (Signed)
Physical Therapy Treatment Patient Details Name: [name] MRN: [id number] DOB: [birth year] Today's Date: 07/21/2013 Time: 7829-5621 PT Time Calculation (min): 17 min  PT Assessment / Plan / Recommendation  History of Present Illness sacroplasty on hold pending insurance, pt likely to d/c to SNF and have procedure Monday 8/4   PT Comments   Pt agreeable to attempt stand pivot to [facility] as she reported need for BM.  Pt educated to come to partial sitting (resting on either hip vs midline) for pain control which pt states helps with decreasing pain during mobility.  Pt feels SNF section of [facility] will be able to assist her until sacroplasty on Monday.   Follow Up Recommendations  SNF;Supervision/Assistance - 24 hour     Does the patient have the potential to tolerate intense rehabilitation     Barriers to Discharge        Equipment Recommendations  None recommended by PT    Recommendations for Other Services    Frequency     Progress towards PT Goals Progress towards PT goals: Progressing toward goals  Plan Current plan remains appropriate    Precautions / Restrictions Precautions Precautions: Fall Restrictions Weight Bearing Restrictions: No   Pertinent Vitals/Pain Increased pain with sitting and mobility sacral area however modified techniques for pain control and pt repositioned in sidelying with pillow between knees upon leaving room   Mobility  Bed Mobility Bed Mobility: Rolling Left;Left Sidelying to Sit;Sit to Sidelying Left Rolling Left: 6: Modified independent (Device/Increase time);With rail Left Sidelying to Sit: 4: Min assist;HOB flat;With rails Sit to Sidelying Left: 3: Mod assist;HOB flat Details for Bed Mobility Assistance: Pt able to roll with heavy use of rail with Mod (I).  pt reports increased pain with sitting so suggested not coming to full sitting but resting on either hip which pt reports feels much better, assist for trunk upright and LEs onto the  bed Transfers Transfers: Sit to Stand;Stand to Sit;Stand Pivot Transfers Sit to Stand: 1: +2 Total assist;From bed;With upper extremity assist Sit to Stand: Patient Percentage: 70% Stand to Sit: To bed;With upper extremity assist;3: Mod assist Stand Pivot Transfers: 3: Mod assist Details for Transfer Assistance: pt assisted to/from [facility], assist required due to pain, +2 for safety, rehab tech assisted with quick hygiene with pt standing and then pivot back to bed with mod assist Ambulation/Gait Ambulation/Gait Assistance: Not tested (comment)    Exercises     PT Diagnosis:    PT Problem List:   PT Treatment Interventions:     PT Goals (current goals can now be found in the care plan section)    Visit Information  Last PT Received On: 07/21/13 Assistance Needed: +2 History of Present Illness: sacroplasty on hold pending insurance, pt likely to d/c to SNF and have procedure Monday 8/4    Subjective Data      Cognition  Cognition Arousal/Alertness: Awake/alert Behavior During Therapy: [facility] for tasks assessed/performed Overall Cognitive Status: Within Functional Limits for tasks assessed    Balance     End of Session PT - End of Session Equipment Utilized During Treatment: Gait belt Activity Tolerance: Patient limited by pain Patient left: in bed;with call bell/phone within reach   GP     [name],KATHrine E 07/21/2013, 12:10 PM Zenovia Jarred, PT, DPT 07/21/2013 Pager: [phone]

## 2013-07-24 ENCOUNTER — Telehealth (HOSPITAL_COMMUNITY): Payer: Self-pay | Admitting: Interventional Radiology

## 2013-07-24 ENCOUNTER — Encounter: Payer: Self-pay | Admitting: Geriatric Medicine

## 2013-07-24 ENCOUNTER — Non-Acute Institutional Stay (SKILLED_NURSING_FACILITY): Payer: Medicare Other | Admitting: Geriatric Medicine

## 2013-07-24 DIAGNOSIS — N390 Urinary tract infection, site not specified: Secondary | ICD-10-CM

## 2013-07-24 DIAGNOSIS — I10 Essential (primary) hypertension: Secondary | ICD-10-CM

## 2013-07-24 DIAGNOSIS — IMO0001 Reserved for inherently not codable concepts without codable children: Secondary | ICD-10-CM

## 2013-07-24 DIAGNOSIS — E871 Hypo-osmolality and hyponatremia: Secondary | ICD-10-CM

## 2013-07-24 DIAGNOSIS — S3210XG Unspecified fracture of sacrum, subsequent encounter for fracture with delayed healing: Secondary | ICD-10-CM

## 2013-07-24 LAB — PROTEIN ELECTROPHORESIS, SERUM
Albumin ELP: 53.7 % — ABNORMAL LOW (ref 55.8–66.1)
Alpha-1-Globulin: 8.1 % — ABNORMAL HIGH (ref 2.9–4.9)
Alpha-2-Globulin: 12.5 % — ABNORMAL HIGH (ref 7.1–11.8)
Beta 2: 4.4 % (ref 3.2–6.5)
Beta Globulin: 6.5 % (ref 4.7–7.2)

## 2013-07-24 NOTE — Assessment & Plan Note (Signed)
Blood pressure remains well-controlled on current medications. HCTZ has been stopped, amlodipine dose was increased. Continue to monitor daily

## 2013-07-24 NOTE — Assessment & Plan Note (Signed)
Repeat MRI showed extension of sacral fracture. Interventional radiology has recommended sacral plasty, is tentatively scheduled for 07/25/2013. Awaiting insurance approval. Patient's pain is improved, she is able to move around better in the bed though she continues to have great difficulty sitting up. She has not been ambulating. Will hold off on resuming PT until after sacroplasty is completed. Continue current pain medication regimen

## 2013-07-24 NOTE — Assessment & Plan Note (Signed)
Improved with stopping HCTZ, repeat labs later this week.

## 2013-07-24 NOTE — Telephone Encounter (Signed)
Called [facility] where pt is currently staying, spoke to [place] (Estate manager/land agent). Let her know that pt's insurance has denied our request for a sacroplasty. She states understanding and that she will talk to pt and family to give them this info. I told her we would be glad to appeal this denial. JMichaux

## 2013-07-24 NOTE — Assessment & Plan Note (Signed)
Greater than 100,000 colonies Escherichia coli on urine culture at the hospital. Patient is completing a 5 day course of Cipro. Asymptomatic.

## 2013-07-24 NOTE — Progress Notes (Signed)
Patient ID: [name], female   DOB: 1924-09-04, 79 y.o.   MRN: [id number] [facility] [phone])  Code Status: Living Will, DNR  Contact Information   Name Relation Home Work [place] Daughter [phone]  [phone]   Rush,Stacie Daughter [phone]  [phone]      Chief Complaint  Patient presents with  . Sacral fracture    HPI: This is a 79 y.o. female resident of [facility], Independent Living section. This patient had a fall 06/24/2013 while visiting in [place], [place]. She was evaluated at the orthopedic office on July 8 with a right hip x-ray that revealed no fracture. Pain progressed to involve her lower back, she returned for orthopedic office on July 16 had x-ray of her lower back that did not reveal any fracture. Patient was admitted to Rehab sectio at [facility] 07/06/13 due to severe right lower back and buttock pain, worse than any of the pain she's had. X-rays of hip, sacrum, pelvis completed 7/17 were without evidence of acute fracture. Pt's pain worsened, also developed rt. Foot numbness. Dr.Duda was called, recommend Hospital evaluation with MRI. This revealed a sacral fracture along with spinal stenosis L3-4, 4-5, L5-S1. Patient was discharged back to the skilled rehabilitation section of [facility] on 7/20 with instructions for pain management to include hydrocodone, Flexeril and prednisone taper. Physical therapy also recommended.  Patient continued to experience significant pain with any movement, Fentanyl transdermal was added 7/24 for pain management. PT had been able to work with this patient, each day she was having a bit less pain with activity.  By 7/29, she was able to transfer without the lift, but experienced significant pain with weight bearing. On 07/19/13 patient reported numbness in left foot (new), PT reports left leg appears a bit weak as compared to the right. Discussed situation with Dr. due  to, he recommended return to the hospital for further medical workup and a repeat MRI.  Patient was admitted to [facility] on 07/19/2013, repeat MRI showed increased edema in the sacral region with increased involvement of the sacral fracture. Interventional Radiology was consulted and recommended for proceeding with Sacroplasty. During hospital workup patient was noted to have urinary tract infection, and so sacroplasty was postponed until 07/25/13.  Patient's pain was reasonably well-controlled and she requested transfer back to [facility] rehabilitation section while waiting for her procedure. Prior to hospitalization patient's HCTZ was placed on hold due to mild hyponatremia. This medication was stopped her in the hospital her hospitalization and her in amlodipine was increased to 10 mg. Patient's blood pressure is well controlled at this time. UTI was treated and appears clearly with Cipro. Review of hospital records today shows culture result with greater than 100,000 Escherichia coli, sensitive to ciprofloxacin. Patient will complete treatment for this infection 07/25/2013    No Known Allergies Medications Reviewed  DATA REVIEWED  Radiologic Exams:    [facility] x-ray 07/06/2013 x-ray pelvis no obvious acute fractures of the pelvis can be seen  X-ray sacrum and coccyx: No definite acute bony abnormalities sacrococcygeal region can be seen minor irregularities noted S1 and S2 level believed to be degenerative  X-ray right hip buttocks to degenerative arthritic changes right hip. No definite acute fractures the right hip  X-ray right femur: No definite acute bony abnormalities right femur can be identified   [facility] Radiology 07/07/2013 MRI lumbar spine: IMPRESSION: 1.  Nondisplaced right sacral ala fracture.  Superimposed chronic central S2 sacral fracture.  Mild presacral  edema in the pelvis. 2.  No acute traumatic injury identified in the lumbar or lower thoracic spine. 3.   Multifactorial moderate spinal and lateral recess stenosis at L3-L4 and L4-L5.  Moderate multifactorial left L5 S1 foraminal Stenosis.  07/19/2013 MRI sacrum without contrast colon. Impression bilateral sacral insufficiency fracture, with horizontal component through S2. The fractures were extensive than a lumbar spine MRI 07/08/2013. No displacement along the sacral foramina. Acute or unhealed right pupil acetabular junction fracture.   Laboratory Studies:    [facility] 07/18/2013 WBC 7.8, hemoglobin 11.0, hematocrit 32.8, platelets 336.  Glucose 94, BUN 23, creatinine 0.91, sodium 129, potassium 93.    Hospital Lab  Lab Results  Component Value Date   WBC 13.8* 07/21/2013   HGB 10.7* 07/21/2013   HCT 31.3* 07/21/2013   PLT 308 07/21/2013   GLUCOSE 93 07/21/2013   ALT 27 09/02/2009   AST 32 09/02/2009   NA 133* 07/21/2013   K 3.7 07/21/2013   CL 99 07/21/2013   CREATININE 0.66 07/21/2013   BUN 15 07/21/2013   CO2 24 07/21/2013   TSH 2.047 07/20/2013   INR 1.01 07/21/2013   Urinalysis    Component Value Date/Time   COLORURINE YELLOW 07/19/2013 2007   APPEARANCEUR CLOUDY* 07/19/2013 2007   LABSPEC 1.009 07/19/2013 2007   PHURINE 6.5 07/19/2013 2007   GLUCOSEU NEGATIVE 07/19/2013 2007   HGBUR NEGATIVE 07/19/2013 2007   BILIRUBINUR NEGATIVE 07/19/2013 2007   KETONESUR NEGATIVE 07/19/2013 2007   PROTEINUR NEGATIVE 07/19/2013 2007   UROBILINOGEN 0.2 07/19/2013 2007   NITRITE NEGATIVE 07/19/2013 2007   LEUKOCYTESUR MODERATE* 07/19/2013 2007    urine culture: Colony Count  >=100,000 COLONIES/ML   Culture  ESCHERICHIA COLI       Review of Systems  DATA OBTAINED: from patient, nurse,  GENERAL:   No fevers, fatigue, change in appetite or weight SKIN: No itch, rash  EYES: No eye pain, dryness or itching  No change in vision EARS: No earache, tinnitus, change in hearing NOSE: No congestion, drainage or bleeding MOUTH/THROAT: No mouth or tooth pain  No sore throat No difficulty chewing or  swallowing RESPIRATORY: No cough, wheezing, SOB CARDIAC: No chest pain, palpitations  No edema. GI: No abdominal pain  No N/V/D or constipation  No heartburn or reflux  GU: No dysuria, frequency or urgency  No change in urine volume or character  MUSCULOSKELETAL: No joint pain, swelling or stiffness  Back pain is less severe, still unable to sit up  comfortably NEUROLOGIC: No dizziness, fainting, headache. Numbness rt. Foot unchnaged/less numbness left lower leg, left foot.  No change in mental status.  PSYCHIATRIC: No feelings of anxiety, depression Sleeps well.      Physical Exam Filed Vitals:   07/24/13 1105  BP: 131/72  Pulse: 97  Temp: 96.1 F (35.6 C)  Resp: 16  Weight: 132 lb 9.6 oz (60.147 kg)  SpO2: 96%   Body mass index is 23.49 kg/(m^2).  GENERAL APPEARANCE: No distress. Appropriately groomed, normal body habitus. Alert, pleasant, conversant. SKIN: No diaphoresis rash, unusual lesions. Superficial wound anterior right leg, significant amount of dark eschar, removed today with gentle cleansing. . Wound base initially pale yellow, improved after cleansing. Covered with Mepitel dressing HEAD: Normocephalic, atraumatic EYES: Conjunctiva/lids clear.  EARS:Hearing grossly normal. NOSE: No deformity or discharge. RESPIRATORY: Breathing is even, unlabored.  MUSCULOSKELETAL: Improved general bed mobility, significantly improved range of motion and strength each leg.  NEUROLOGIC: Oriented to time, place, person. Speech clear, no tremor. "  they feel like they are asleep" PSYCHIATRIC: Mood and affect appropriate to situation  ASSESSMENT/PLAN  Sacral fracture, closed Repeat MRI showed extension of sacral fracture. Interventional radiology has recommended sacral plasty, is tentatively scheduled for 07/25/2013. Awaiting insurance approval. Patient's pain is improved, she is able to move around better in the bed though she continues to have great difficulty sitting up. She has not been  ambulating. Will hold off on resuming PT until after sacroplasty is completed. Continue current pain medication regimen  Hyponatremia Improved with stopping HCTZ, repeat labs later this week.  Essential hypertension, benign Blood pressure remains well-controlled on current medications. HCTZ has been stopped, amlodipine dose was increased. Continue to monitor daily  UTI (lower urinary tract infection) Greater than 100,000 colonies Escherichia coli on urine culture at the hospital. Patient is completing a 5 day course of Cipro. Asymptomatic.  Follow up: As needed   Stephano Arrants T.Abilene Mcphee, NP-C 07/24/2013

## 2013-07-27 ENCOUNTER — Other Ambulatory Visit (HOSPITAL_COMMUNITY): Payer: Self-pay | Admitting: Interventional Radiology

## 2013-07-27 ENCOUNTER — Encounter (HOSPITAL_COMMUNITY): Payer: Self-pay | Admitting: Pharmacy Technician

## 2013-07-27 ENCOUNTER — Other Ambulatory Visit: Payer: Self-pay | Admitting: Radiology

## 2013-07-27 DIAGNOSIS — IMO0002 Reserved for concepts with insufficient information to code with codable children: Secondary | ICD-10-CM

## 2013-07-27 DIAGNOSIS — M549 Dorsalgia, unspecified: Secondary | ICD-10-CM

## 2013-07-28 ENCOUNTER — Encounter (HOSPITAL_COMMUNITY): Payer: Self-pay

## 2013-07-28 ENCOUNTER — Ambulatory Visit (HOSPITAL_COMMUNITY)
Admission: RE | Admit: 2013-07-28 | Discharge: 2013-07-28 | Disposition: A | Discharge status: Home / Self Care | Payer: Medicare Other | Source: Ambulatory Visit | Attending: Interventional Radiology | Admitting: Interventional Radiology

## 2013-07-28 DIAGNOSIS — M810 Age-related osteoporosis without current pathological fracture: Secondary | ICD-10-CM | POA: Insufficient documentation

## 2013-07-28 DIAGNOSIS — S3210XA Unspecified fracture of sacrum, initial encounter for closed fracture: Secondary | ICD-10-CM | POA: Insufficient documentation

## 2013-07-28 DIAGNOSIS — IMO0002 Reserved for concepts with insufficient information to code with codable children: Secondary | ICD-10-CM

## 2013-07-28 DIAGNOSIS — H3530 Unspecified macular degeneration: Secondary | ICD-10-CM | POA: Insufficient documentation

## 2013-07-28 DIAGNOSIS — M549 Dorsalgia, unspecified: Secondary | ICD-10-CM

## 2013-07-28 DIAGNOSIS — Y929 Unspecified place or not applicable: Secondary | ICD-10-CM | POA: Insufficient documentation

## 2013-07-28 DIAGNOSIS — I10 Essential (primary) hypertension: Secondary | ICD-10-CM | POA: Insufficient documentation

## 2013-07-28 DIAGNOSIS — M1990 Unspecified osteoarthritis, unspecified site: Secondary | ICD-10-CM | POA: Insufficient documentation

## 2013-07-28 DIAGNOSIS — I872 Venous insufficiency (chronic) (peripheral): Secondary | ICD-10-CM | POA: Insufficient documentation

## 2013-07-28 DIAGNOSIS — Z87891 Personal history of nicotine dependence: Secondary | ICD-10-CM | POA: Insufficient documentation

## 2013-07-28 DIAGNOSIS — W19XXXA Unspecified fall, initial encounter: Secondary | ICD-10-CM | POA: Insufficient documentation

## 2013-07-28 DIAGNOSIS — J309 Allergic rhinitis, unspecified: Secondary | ICD-10-CM | POA: Insufficient documentation

## 2013-07-28 LAB — BASIC METABOLIC PANEL
Calcium: 9.9 mg/dL (ref 8.4–10.5)
Creatinine, Ser: 0.86 mg/dL (ref 0.50–1.10)
GFR calc Af Amer: 69 mL/min — ABNORMAL LOW (ref >90)
GFR calc non Af Amer: 59 mL/min — ABNORMAL LOW (ref >90)
Sodium: 127 mEq/L — ABNORMAL LOW (ref 135–145)

## 2013-07-28 LAB — CBC
Platelets: 262 10*3/uL (ref 150–400)
RBC: 4.03 MIL/uL (ref 3.87–5.11)
RDW: 13.2 % (ref 11.5–15.5)
WBC: 5.7 10*3/uL (ref 4.0–10.5)

## 2013-07-28 LAB — APTT: aPTT: 38 seconds — ABNORMAL HIGH (ref 24–37)

## 2013-07-28 LAB — PROTIME-INR: Prothrombin Time: 12.6 seconds (ref 11.6–15.2)

## 2013-07-28 MED ORDER — MIDAZOLAM HCL 2 MG/2ML IJ SOLN
INTRAMUSCULAR | Status: AC | PRN
  Administered 2013-07-28: 1 mg via INTRAVENOUS
  Administered 2013-07-28: 0.5 mg via INTRAVENOUS

## 2013-07-28 MED ORDER — HYDROMORPHONE HCL PF 1 MG/ML IJ SOLN
INTRAMUSCULAR | Status: AC
  Filled 2013-07-28: qty 3

## 2013-07-28 MED ORDER — SODIUM CHLORIDE 0.9 % IV SOLN
Freq: Once | INTRAVENOUS | Status: AC
  Administered 2013-07-28: 08:00:00 via INTRAVENOUS

## 2013-07-28 MED ORDER — FENTANYL CITRATE 0.05 MG/ML IJ SOLN
INTRAMUSCULAR | Status: AC
  Filled 2013-07-28: qty 4

## 2013-07-28 MED ORDER — TOBRAMYCIN SULFATE 1.2 G IJ SOLR
INTRAMUSCULAR | Status: AC
  Filled 2013-07-28: qty 1.2

## 2013-07-28 MED ORDER — SODIUM CHLORIDE 0.9 % IV SOLN: INTRAVENOUS | Status: AC

## 2013-07-28 MED ORDER — FENTANYL CITRATE 0.05 MG/ML IJ SOLN
INTRAMUSCULAR | Status: AC | PRN
  Administered 2013-07-28: 12.5 ug via INTRAVENOUS
  Administered 2013-07-28: 25 ug via INTRAVENOUS

## 2013-07-28 MED ORDER — CEFAZOLIN SODIUM-DEXTROSE 2-3 GM-% IV SOLR
2.0000 g | Freq: Once | INTRAVENOUS | Status: AC
  Administered 2013-07-28: 2 g via INTRAVENOUS
  Filled 2013-07-28: qty 50

## 2013-07-28 MED ORDER — GELATIN ABSORBABLE 12-7 MM EX MISC
CUTANEOUS | Status: AC
  Filled 2013-07-28: qty 1

## 2013-07-28 MED ORDER — MIDAZOLAM HCL 2 MG/2ML IJ SOLN
INTRAMUSCULAR | Status: AC
  Filled 2013-07-28: qty 6

## 2013-07-28 MED ORDER — IOHEXOL 300 MG/ML  SOLN
50.0000 mL | Freq: Once | INTRAMUSCULAR | Status: AC | PRN
  Administered 2013-07-28: 15 mL via INTRAVENOUS

## 2013-07-28 NOTE — ED Notes (Signed)
Requested bed from [facility]; spoke with renee

## 2013-07-28 NOTE — Procedures (Signed)
S/P bilateral S1 sacroplasty.

## 2013-07-28 NOTE — H&P (Signed)
[name] is an 79 y.o. female.   Chief Complaint: Pt fell 06/24/12 Has had back pain and worsening Rt leg and B Buttock pain Recent MRI reveals B sacral fracture and horizontal component through S2 Pt has been treated for UTI- finishing course of Cipro- asymptomatic Scheduled for B sacroplasty HPI: HTN; osteoporosis; mac degeneration; DDD; spinal stenosis  Past Medical History  Diagnosis Date  . Arthritis   . Osteoporosis   . Essential hypertension, benign   . Osteoarthrosis, unspecified whether generalized or localized, unspecified site   . Other malaise and fatigue     re: stress, anxiety  . Allergic rhinitis, cause unspecified   . Osteoporosis/osteopenia increased risk   . Macular degeneration   . Venous insufficiency   . Cataract cortical, senile 2010    s/p bil extract/IOL implants   . Degenerative disk disease 06/2013  . Spinal stenosis of lumbar region 07/08/2013  . Hyponatremia 07/19/2013    Past Surgical History  Procedure Laterality Date  . Total hip arthroplasty Left 2005  . Replacement total knee Right 2010    Family History  Problem Relation Age of Onset  . Esophageal cancer Mother 30  . Breast cancer Daughter   . Thyroid disease Daughter    Social History:  reports that she quit smoking about 50 years ago. She does not have any smokeless tobacco history on file. She reports that she drinks about 4.2 ounces of alcohol per week. She reports that she does not use illicit drugs.  Allergies: No Known Allergies   (Not in a hospital admission)  Results for orders placed during the hospital encounter of 07/28/13 (from the past 48 hour(s))  CBC     Status: Abnormal   Collection Time    07/28/13  6:46 AM      Result Value Range   WBC 5.7  4.0 - 10.5 K/uL   RBC 4.03  3.87 - 5.11 MIL/uL   Hemoglobin 12.1  12.0 - 15.0 g/dL   HCT 16.1 (*) 09.6 - 04.5 %   MCV 88.3  78.0 - 100.0 fL   MCH 30.0  26.0 - 34.0 pg   MCHC 34.0  30.0 - 36.0 g/dL   RDW 40.9  81.1 - 91.4 %    Platelets 262  150 - 400 K/uL   No results found.  Review of Systems  Constitutional: Negative for fever and chills.  Respiratory: Negative for shortness of breath.   Cardiovascular: Negative for chest pain.  Gastrointestinal: Negative for nausea, vomiting and abdominal pain.  Musculoskeletal: Positive for back pain.       Buttock pain  Neurological: Positive for weakness. Negative for headaches.    Blood pressure 145/87, pulse 92, temperature 98 F (36.7 C), temperature source Oral, resp. rate 20, height 5\' 3"  (1.6 m), weight 132 lb (59.875 kg), SpO2 96.00%. Physical Exam  Constitutional: She is oriented to person, place, and time. She appears well-nourished.  Cardiovascular: Normal rate, regular rhythm and normal heart sounds.   No murmur heard. Respiratory: Effort normal and breath sounds normal. She has no wheezes.  GI: Soft. Bowel sounds are normal. There is no tenderness.  Musculoskeletal: Normal range of motion.  Moves all 4s Not walking much- back pain severe  Neurological: She is alert and oriented to person, place, and time.  Skin: Skin is warm and dry.  Psychiatric: She has a normal mood and affect. Her behavior is normal. Judgment normal.     Assessment/Plan Larey Seat 06/24/12 Back and buttock pain  since then MRI shows B sacral fx Scheduled now for B sacroplasty Pt and dtrs aware of procedure benefits and risks and agreeable to proceed Consent signed and in chart  Trysten Bernard A 07/28/2013, 7:41 AM

## 2013-07-28 NOTE — ED Notes (Signed)
Patient denies pain and is resting comfortably.  

## 2013-07-28 NOTE — ED Notes (Signed)
Family updated as to patient's status by Dr. Corliss Skains

## 2013-07-31 ENCOUNTER — Encounter: Payer: Self-pay | Admitting: Geriatric Medicine

## 2013-07-31 ENCOUNTER — Non-Acute Institutional Stay (SKILLED_NURSING_FACILITY): Payer: Medicare Other | Admitting: Geriatric Medicine

## 2013-07-31 DIAGNOSIS — S3210XD Unspecified fracture of sacrum, subsequent encounter for fracture with routine healing: Secondary | ICD-10-CM

## 2013-07-31 DIAGNOSIS — R29898 Other symptoms and signs involving the musculoskeletal system: Secondary | ICD-10-CM | POA: Insufficient documentation

## 2013-07-31 DIAGNOSIS — I10 Essential (primary) hypertension: Secondary | ICD-10-CM

## 2013-07-31 DIAGNOSIS — IMO0001 Reserved for inherently not codable concepts without codable children: Secondary | ICD-10-CM

## 2013-07-31 DIAGNOSIS — E871 Hypo-osmolality and hyponatremia: Secondary | ICD-10-CM

## 2013-07-31 NOTE — Assessment & Plan Note (Signed)
B.i.d. blood pressure monitoring has been satisfactory with current medications.

## 2013-07-31 NOTE — Assessment & Plan Note (Signed)
Weakness rt. Lower leg/ ankle due to inflammation/ pressure re: sacral fracture. Continue PT with careful attempts at ambulation until muscle strength has returned

## 2013-07-31 NOTE — Progress Notes (Signed)
Patient ID: [name], female   DOB: 08-Jun-1924, 79 y.o.   MRN: [id number] [facility] [phone])  Code Status: Living Will, DNR  Contact Information   Name Relation Home Work [address] Daughter [phone]  [phone]   Rush,Stacie Daughter [phone]  [phone]      Chief Complaint  Patient presents with  . sacral fracture    HPI: This is a 79 y.o. female resident of [facility], Independent Living section. This patient had a fall 06/24/2013 while visiting in [place], [place]. She was evaluated at the orthopedic office on July 8 with a right hip x-ray that revealed no fracture. Pain progressed to involve her lower back, she returned for orthopedic office on July 16 had x-ray of her lower back that did not reveal any fracture. Patient was admitted to Rehab sectio at [facility] 07/06/13 due to severe right lower back and buttock pain, worse than any of the pain she's had. X-rays of hip, sacrum, pelvis completed 7/17 were without evidence of acute fracture. Pt's pain worsened, also developed rt. Foot numbness. Dr.Duda was called, recommend Hospital evaluation with MRI. This revealed a sacral fracture along with spinal stenosis L3-4, 4-5, L5-S1. Patient was discharged back to the skilled rehabilitation section of [facility] on 7/20 with instructions for pain management to include hydrocodone, Flexeril and prednisone taper. Physical therapy also recommended.  Patient continued to experience significant pain with any movement, was re-admitted to [facility] on 07/19/2013, repeat MRI showed increased edema in the sacral region with increased involvement of the sacral fracture. Interventional Radiology was consulted and recommended for proceeding with Sacroplasty. During hospital workup patient was noted to have urinary tract infection, and so sacroplasty was postponed until 07/25/13.  Patient's pain was reasonably well-controlled and she  requested transfer back to [facility] rehabilitation section while waiting for her procedure. Sacroplasty was delayed further do to insurance issues, patient was able to undergo this procedure on Friday, 07/28/2013. Patient has had significantly less pain since sacral plasty has was completed, today she participated with physical therapy including sitting on the edge of the bed standing and ambulating with a walker and assistance. Patient had no complaints of pain. She does continue to have numbness in her right foot and difficulty ambulating, right foot is turning in with ambulation.   Prior to hospitalization patient's HCTZ was placed on hold due to mild hyponatremia. This medication was stopped her in the hospital her hospitalization and her in amlodipine was increased to 10 mg. Patient's blood pressure is well controlled at this time.    No Known Allergies Medications Reviewed   DATA REVIEWED  Radiologic Exams:    [facility] x-ray 07/06/2013 x-ray pelvis no obvious acute fractures of the pelvis can be seen  X-ray sacrum and coccyx: No definite acute bony abnormalities sacrococcygeal region can be seen minor irregularities noted S1 and S2 level believed to be degenerative  X-ray right hip buttocks to degenerative arthritic changes right hip. No definite acute fractures the right hip  X-ray right femur: No definite acute bony abnormalities right femur can be identified   [facility] 07/07/2013 MRI lumbar spine: IMPRESSION: 1.  Nondisplaced right sacral ala fracture.  Superimposed chronic central S2 sacral fracture.  Mild presacral edema in the pelvis. 2.  No acute traumatic injury identified in the lumbar or lower thoracic spine. 3.  Multifactorial moderate spinal and lateral recess stenosis at L3-L4 and L4-L5.  Moderate multifactorial left L5 S1 foraminal Stenosis.  07/19/2013 MRI  sacrum without contrast colon. Impression bilateral sacral insufficiency fracture, with horizontal  component through S2. The fractures were extensive than a lumbar spine MRI 07/08/2013. No displacement along the sacral foramina. Acute or unhealed right pupil acetabular junction fracture.   Laboratory Studies:    [facility] 07/18/2013 WBC 7.8, hemoglobin 11.0, hematocrit 32.8, platelets 336.  Glucose 94, BUN 23, creatinine 0.91, sodium 129, potassium 93.    Hospital Lab  Lab Results  Component Value Date   WBC 5.7 07/28/2013   HGB 12.1 07/28/2013   HCT 35.6* 07/28/2013   PLT 262 07/28/2013   GLUCOSE 115* 07/28/2013   ALT 27 09/02/2009   AST 32 09/02/2009   NA 127* 07/28/2013   K 4.4 07/28/2013   CL 90* 07/28/2013   CREATININE 0.86 07/28/2013   BUN 14 07/28/2013   CO2 28 07/28/2013   TSH 2.047 07/20/2013   INR 0.96 07/28/2013   Urinalysis    Component Value Date/Time   COLORURINE YELLOW 07/19/2013 2007   APPEARANCEUR CLOUDY* 07/19/2013 2007   LABSPEC 1.009 07/19/2013 2007   PHURINE 6.5 07/19/2013 2007   GLUCOSEU NEGATIVE 07/19/2013 2007   HGBUR NEGATIVE 07/19/2013 2007   BILIRUBINUR NEGATIVE 07/19/2013 2007   KETONESUR NEGATIVE 07/19/2013 2007   PROTEINUR NEGATIVE 07/19/2013 2007   UROBILINOGEN 0.2 07/19/2013 2007   NITRITE NEGATIVE 07/19/2013 2007   LEUKOCYTESUR MODERATE* 07/19/2013 2007    urine culture: Colony Count  >=100,000 COLONIES/ML   Culture  ESCHERICHIA COLI       Review of Systems  DATA OBTAINED: from patient, nurse,  GENERAL:   No fevers, fatigue, change in appetite or weight SKIN: No itch, rash  EYES: No eye pain, dryness or itching  No change in vision EARS: No earache, tinnitus, change in hearing NOSE: No congestion, drainage or bleeding MOUTH/THROAT: No mouth or tooth pain  No sore throat No difficulty chewing or swallowing RESPIRATORY: No cough, wheezing, SOB CARDIAC: No chest pain, palpitations  No edema. GI: No abdominal pain  No N/V/D or constipation  No heartburn or reflux  GU: No dysuria, frequency or urgency  No change in urine volume or character  MUSCULOSKELETAL: No  joint pain, swelling or stiffness  No Back pain, able to sit up  comfortably NEUROLOGIC: No dizziness, fainting, headache. Numbness rt. Foot unchanged/ess numbness left lower leg, left foot.  No change in mental status.  PSYCHIATRIC: No feelings of anxiety, depression Sleeps well.      Physical Exam Filed Vitals:   07/31/13 1224  BP: 109/59  Pulse: 95  Temp: 97.5 F (36.4 C)  Resp: 18  SpO2: 93%   GENERAL APPEARANCE: No distress. Appropriately groomed, normal body habitus. Alert, pleasant, conversant. SKIN: No diaphoresis rash, unusual lesions. Superficial wound anterior right leg, wound edges contracting slowly.  HEAD: Normocephalic, atraumatic EYES: Conjunctiva/lids clear.  EARS:Hearing grossly normal. NOSE: No deformity or discharge. RESPIRATORY: Breathing is even, unlabored.  MUSCULOSKELETAL: Moves all extremities FROM without pain. Mild rt. LE weakness and inability to flex rt. Foot straight NEUROLOGIC: Oriented to time, place, person. Speech clear, no tremor.  PSYCHIATRIC: Mood and affect appropriate to situation  ASSESSMENT/PLAN  Sacral fracture, closed Status post sacral plasty 07/28/2013 with good result. Patient is pain-free today. Will stop fentanyl patch, continue p.r.n. hydrocodone she should develop pain with return of activity.  Weakness of right leg Weakness rt. Lower leg/ ankle due to inflammation/ pressure re: sacral fracture. Continue PT with careful attempts at ambulation until muscle strength has returned  Hyponatremia Repeat labs tomorrow  Essential hypertension, benign B.i.d. blood pressure monitoring has been satisfactory with current medications.  Follow up: As needed Lab 08/01/13 BMP  Khyran Riera T.Mignonne Afonso, NP-C 07/31/2013

## 2013-07-31 NOTE — Assessment & Plan Note (Signed)
Repeat labs tomorrow

## 2013-07-31 NOTE — Assessment & Plan Note (Addendum)
Status post sacral plasty 07/28/2013 with good result. Patient is pain-free today. Will stop fentanyl patch, continue p.r.n. hydrocodone she should develop pain with return of activity.

## 2013-08-03 ENCOUNTER — Encounter: Payer: Self-pay | Admitting: Geriatric Medicine

## 2013-08-03 NOTE — Progress Notes (Signed)
This encounter was created in error - please disregard.

## 2013-08-07 ENCOUNTER — Other Ambulatory Visit (HOSPITAL_COMMUNITY): Payer: Self-pay | Admitting: Interventional Radiology

## 2013-08-07 DIAGNOSIS — IMO0002 Reserved for concepts with insufficient information to code with codable children: Secondary | ICD-10-CM

## 2013-08-07 DIAGNOSIS — M549 Dorsalgia, unspecified: Secondary | ICD-10-CM

## 2013-08-08 ENCOUNTER — Encounter: Payer: Self-pay | Admitting: Geriatric Medicine

## 2013-08-08 ENCOUNTER — Non-Acute Institutional Stay (SKILLED_NURSING_FACILITY): Payer: Medicare Other | Admitting: Geriatric Medicine

## 2013-08-08 DIAGNOSIS — E871 Hypo-osmolality and hyponatremia: Secondary | ICD-10-CM

## 2013-08-08 DIAGNOSIS — IMO0001 Reserved for inherently not codable concepts without codable children: Secondary | ICD-10-CM

## 2013-08-08 DIAGNOSIS — S3210XD Unspecified fracture of sacrum, subsequent encounter for fracture with routine healing: Secondary | ICD-10-CM

## 2013-08-08 DIAGNOSIS — Z5189 Encounter for other specified aftercare: Secondary | ICD-10-CM

## 2013-08-08 DIAGNOSIS — M216X9 Other acquired deformities of unspecified foot: Secondary | ICD-10-CM

## 2013-08-08 DIAGNOSIS — I10 Essential (primary) hypertension: Secondary | ICD-10-CM

## 2013-08-08 DIAGNOSIS — M21371 Foot drop, right foot: Secondary | ICD-10-CM

## 2013-08-08 DIAGNOSIS — S81801D Unspecified open wound, right lower leg, subsequent encounter: Secondary | ICD-10-CM

## 2013-08-08 HISTORY — DX: Foot drop, right foot: M21.371

## 2013-08-08 NOTE — Progress Notes (Signed)
Patient ID: [name], female   DOB: 1924-09-15, 79 y.o.   MRN: [id number] [facility] [phone])  Code Status: Living Will, DNR  Contact Information   Name Relation Home Work [place] Daughter [phone]  [phone]   Rush,Stacie Daughter [phone]  [phone]      Chief Complaint  Patient presents with  . Sacral fracture  . Foot drop    HPI: This is a 79 y.o. female resident of [facility], Independent Living section. This patient had a fall 06/24/2013 while visiting in [place], [place]. She was evaluated at the orthopedic office on July 8 with a right hip x-ray that revealed no fracture. Pain progressed to involve her lower back, she returned for orthopedic office on July 16 had x-ray of her lower back that did not reveal any fracture. Patient was admitted to Rehab sectio at [facility] 07/06/13 due to severe right lower back and buttock pain, worse than any of the pain she's had. X-rays of hip, sacrum, pelvis completed 7/17 were without evidence of acute fracture. Pt's pain worsened, also developed rt. Foot numbness. Dr.Duda was called, recommend Hospital evaluation with MRI. This revealed a sacral fracture along with spinal stenosis L3-4, 4-5, L5-S1. Patient was discharged back to the skilled rehabilitation section of [facility] on 7/20 with instructions for pain management to include hydrocodone, Flexeril and prednisone taper. Physical therapy also recommended.  Patient continued to experience significant pain with any movement, was re-admitted to [facility] on 07/19/2013, repeat MRI showed increased edema in the sacral region with increased involvement of the sacral fracture. Interventional Radiology was consulted and recommended proceeding with Sacroplasty. During hospital workup patient was noted to have urinary tract infection, and so sacroplasty was postponed until 07/25/13.  Patient's pain was reasonably well-controlled  and she requested transfer back to [facility] rehabilitation section while waiting for her procedure. Sacroplasty was delayed further do to insurance issues, patient was able to undergo this procedure on Friday, 07/28/2013.   Patient has had significantly less pain since sacroplasty was completed, using pain medication intermittently,  is gaining independence with ADLs. Patient is participating with physical therapy; ambulating with a walker and assistance. Ambulation is impaired due to numbness and foot drop Rt. Foot. She was evaluated last week (8/14) by Dr.Duda, he recommends an AFO for support of her right foot as well as progressive dorsiflexion and eversion stretching to avoid contracture. AFO has been ordered, awaiting insurance approval and fitting.  Prior to hospitalization patient's HCTZ was placed on hold due to mild hyponatremia. This medication was stopped during hospitalization, amlodipine was increased to 10 mg. Patient's blood pressure remains well controlled. Most recent BMP satisfactory.   No Known Allergies Medications Reviewed   DATA REVIEWED  Radiologic Exams:    [facility] x-ray 07/06/2013 x-ray pelvis no obvious acute fractures of the pelvis can be seen  X-ray sacrum and coccyx: No definite acute bony abnormalities sacrococcygeal region can be seen minor irregularities noted S1 and S2 level believed to be degenerative  X-ray right hip buttocks to degenerative arthritic changes right hip. No definite acute fractures the right hip  X-ray right femur: No definite acute bony abnormalities right femur can be identified   [facility] 07/07/2013 MRI lumbar spine: IMPRESSION: 1.  Nondisplaced right sacral ala fracture.  Superimposed chronic central S2 sacral fracture.  Mild presacral edema in the pelvis. 2.  No acute traumatic injury identified in the lumbar or lower thoracic spine. 3.  Multifactorial moderate spinal and  lateral recess stenosis at L3-L4 and L4-L5.   Moderate multifactorial left L5 S1 foraminal Stenosis.  07/19/2013 MRI sacrum without contrast colon. Impression bilateral sacral insufficiency fracture, with horizontal component through S2. The fractures were extensive than a lumbar spine MRI 07/08/2013. No displacement along the sacral foramina. Acute or unhealed right pupil acetabular junction fracture.   Laboratory Studies:    [facility] 07/18/2013 WBC 7.8, hemoglobin 11.0, hematocrit 32.8, platelets 336.  Glucose 94, BUN 23, creatinine 0.91, sodium 129, potassium 93.  08/01/2013 Glucose 97, BUN 20, creatinine 0.79, sodium 132, potassium 4.4    Hospital Lab  Lab Results  Component Value Date   WBC 5.7 07/28/2013   HGB 12.1 07/28/2013   HCT 35.6* 07/28/2013   PLT 262 07/28/2013   GLUCOSE 115* 07/28/2013   ALT 27 09/02/2009   AST 32 09/02/2009   NA 127* 07/28/2013   K 4.4 07/28/2013   CL 90* 07/28/2013   CREATININE 0.86 07/28/2013   BUN 14 07/28/2013   CO2 28 07/28/2013   TSH 2.047 07/20/2013   INR 0.96 07/28/2013   Urinalysis    Component Value Date/Time   COLORURINE YELLOW 07/19/2013 2007   APPEARANCEUR CLOUDY* 07/19/2013 2007   LABSPEC 1.009 07/19/2013 2007   PHURINE 6.5 07/19/2013 2007   GLUCOSEU NEGATIVE 07/19/2013 2007   HGBUR NEGATIVE 07/19/2013 2007   BILIRUBINUR NEGATIVE 07/19/2013 2007   KETONESUR NEGATIVE 07/19/2013 2007   PROTEINUR NEGATIVE 07/19/2013 2007   UROBILINOGEN 0.2 07/19/2013 2007   NITRITE NEGATIVE 07/19/2013 2007   LEUKOCYTESUR MODERATE* 07/19/2013 2007    urine culture: Colony Count  >=100,000 COLONIES/ML   Culture  ESCHERICHIA COLI       Review of Systems  DATA OBTAINED: from patient, nurse,  GENERAL:   No fevers, fatigue, change in appetite or weight SKIN: No itch, rash  EYES: No eye pain, dryness or itching  No change in vision EARS: No earache, tinnitus, change in hearing NOSE: No congestion, drainage or bleeding MOUTH/THROAT: No mouth or tooth pain  No sore throat No difficulty chewing or  swallowing RESPIRATORY: No cough, wheezing, SOB CARDIAC: No chest pain, palpitations  No edema. GI: No abdominal pain  No N/V/D or constipation  No heartburn or reflux  Flatulence present GU: No dysuria, frequency or urgency  No change in urine volume or character  MUSCULOSKELETAL: No joint pain, swelling or stiffness   No Back pain, able to sit up  comfortably NEUROLOGIC: No dizziness, fainting, headache. Numbness rt. Foot unchanged/less numbness left lower leg, left foot.  No change in mental status.  PSYCHIATRIC: No feelings of anxiety, depression Sleeps well.      Physical Exam Filed Vitals:   08/08/13 1149  BP: 147/69  Pulse: 80  Weight: 133 lb 6.4 oz (60.51 kg)  Body mass index is 23.64 kg/(m^2).  GENERAL APPEARANCE: No distress. Appropriately groomed, normal body habitus. Alert, pleasant, conversant. SKIN: No diaphoresis rash, unusual lesions. Superficial wound anterior right leg, wound edges contracting slowly. Thick, dry eschar present HEAD: Normocephalic, atraumatic EYES: Conjunctiva/lids clear.  EARS:Hearing grossly normal. NOSE: No deformity or discharge. RESPIRATORY: Breathing is even, unlabored.  MUSCULOSKELETAL: Moves all extremities FROM without pain. Mild rt. LE weakness and inability to flex rt. Foot straight NEUROLOGIC: Oriented to time, place, person. Speech clear, no tremor.  PSYCHIATRIC: Mood and affect appropriate to situation  ASSESSMENT/PLAN  Essential hypertension, benign Stable, continue current medications  Sacral fracture, closed Significant improvement after sacroplasty. Patient is due for return visit with Dr. Link Snuffer later this  week.  Hyponatremia Resolved off diuretic.  Right foot drop Numbness and weakness right foot, and ambulation. Continue PT interventions to strengthen right ankle, a weight fitting and delivery of right foot AFO.  Wound, open, leg Large superficial wound of right anterior shin occurred during fall in July 2014. This  wound has remained clean but has been very slow healing. There is some contraction of the wound edges. Currently there is a thick, dry eschar over most of the wound likely impairing further healing. Wound with Mepilex borderline dressing to encourage more short retention and hopefully gentle removal of the eschar.  Follow up: As needed  Caitlyne Ingham T.Mady Oubre, NP-C 08/08/2013

## 2013-08-08 NOTE — Assessment & Plan Note (Signed)
Numbness and weakness right foot, and ambulation. Continue PT interventions to strengthen right ankle, a weight fitting and delivery of right foot AFO.

## 2013-08-08 NOTE — Assessment & Plan Note (Signed)
Stable, continue current medications.  

## 2013-08-08 NOTE — Assessment & Plan Note (Signed)
Significant improvement after sacroplasty. Patient is due for return visit with Dr. Link Snuffer later this week.

## 2013-08-08 NOTE — Assessment & Plan Note (Signed)
Resolved off diuretic  

## 2013-08-08 NOTE — Assessment & Plan Note (Signed)
Large superficial wound of right anterior shin occurred during fall in July 2014. This wound has remained clean but has been very slow healing. There is some contraction of the wound edges. Currently there is a thick, dry eschar over most of the wound likely impairing further healing. Wound with Mepilex borderline dressing to encourage more short retention and hopefully gentle removal of the eschar.

## 2013-08-11 ENCOUNTER — Ambulatory Visit (HOSPITAL_COMMUNITY)
Admission: RE | Admit: 2013-08-11 | Discharge: 2013-08-11 | Disposition: A | Discharge status: Home / Self Care | Payer: Medicare Other | Source: Ambulatory Visit | Attending: Interventional Radiology | Admitting: Interventional Radiology

## 2013-08-11 DIAGNOSIS — IMO0002 Reserved for concepts with insufficient information to code with codable children: Secondary | ICD-10-CM

## 2013-08-11 DIAGNOSIS — M549 Dorsalgia, unspecified: Secondary | ICD-10-CM

## 2013-08-25 ENCOUNTER — Non-Acute Institutional Stay (SKILLED_NURSING_FACILITY): Payer: Medicare Other | Admitting: Geriatric Medicine

## 2013-08-25 ENCOUNTER — Encounter: Payer: Self-pay | Admitting: Geriatric Medicine

## 2013-08-25 DIAGNOSIS — IMO0001 Reserved for inherently not codable concepts without codable children: Secondary | ICD-10-CM

## 2013-08-25 DIAGNOSIS — S81801D Unspecified open wound, right lower leg, subsequent encounter: Secondary | ICD-10-CM

## 2013-08-25 DIAGNOSIS — E871 Hypo-osmolality and hyponatremia: Secondary | ICD-10-CM

## 2013-08-25 DIAGNOSIS — I10 Essential (primary) hypertension: Secondary | ICD-10-CM

## 2013-08-25 DIAGNOSIS — S3210XD Unspecified fracture of sacrum, subsequent encounter for fracture with routine healing: Secondary | ICD-10-CM

## 2013-08-25 DIAGNOSIS — M21371 Foot drop, right foot: Secondary | ICD-10-CM

## 2013-08-25 DIAGNOSIS — Z5189 Encounter for other specified aftercare: Secondary | ICD-10-CM

## 2013-08-25 DIAGNOSIS — M216X9 Other acquired deformities of unspecified foot: Secondary | ICD-10-CM

## 2013-08-25 NOTE — Assessment & Plan Note (Signed)
Healing well, no pain or restriction of movement.

## 2013-08-25 NOTE — Assessment & Plan Note (Signed)
Healed

## 2013-08-25 NOTE — Progress Notes (Signed)
Patient ID: [name], female   DOB: [birth year], 79 y.o.   MRN: [id number] [facility] [phone])  Code Status: Living Will, DNR  Contact Information   Name Relation Home Work [place] Daughter [phone]  [phone]   Rush,Stacie Daughter [phone]  [phone]      Chief Complaint  Patient presents with  . sacral fracture  . drop foot  . Hypertension  . leg wound    HPI: This is a 79 y.o. female resident of [facility], Independent Living section. This patient had a fall 06/24/2013 while visiting in [place], [place]. She was evaluated at the orthopedic office on July 8 with a right hip x-ray that revealed no fracture. Pain progressed to involve her lower back, she returned for orthopedic office on July 16 had x-ray of her lower back that did not reveal any fracture. Patient was admitted to Rehab sectio at [facility] 07/06/13 due to severe right lower back and buttock pain, worse than any of the pain she's had. X-rays of hip, sacrum, pelvis completed 7/17 were without evidence of acute fracture. Pt's pain worsened, also developed rt. Foot numbness. Dr.Duda was called, recommend Hospital evaluation with MRI. This revealed a sacral fracture along with spinal stenosis L3-4, 4-5, L5-S1. Patient was discharged back to the skilled rehabilitation section of [facility] on 7/20 with instructions for pain management to include hydrocodone, Flexeril and prednisone taper. Physical therapy also recommended.  Patient continued to experience significant pain with any movement, was re-admitted to [facility] on 07/19/2013, repeat MRI showed increased edema in the sacral region with increased involvement of the sacral fracture. Interventional Radiology was consulted and recommended proceeding with Sacroplasty. During hospital workup patient was noted to have urinary tract infection, and so sacroplasty was postponed until 07/25/13.  Patient's pain  was reasonably well-controlled and she requested transfer back to [facility] rehabilitation section while waiting for her procedure. Sacroplasty was delayed further do to insurance issues, patient was able to undergo this procedure on Friday, 07/28/2013.   Patient has continued to recover from her sacral fracture very well, reports no pain in any position.   Ambulation remains impaired due to numbness and foot drop Rt. Foot, the symptoms are improving as well. Patient continues to have discomfort in her right foot especially in the evening, she continues to use one hydrocodone at bedtime to relieve this discomfort. Patient returned to Dr. Lajoyce Corners August 26, he increased the Neurontin to 300 mg twice a day. Encouraged continued work with physical therapy to avoid ankle contracture, gain general strength and gait stability. He gave instructions regarding use of AFO during ambulation and a lambswool splint at other times.    Prior to hospitalization patient's HCTZ was placed on hold due to mild hyponatremia. This medication was stopped during hospitalization, amlodipine was increased to 10 mg. Patient's blood pressure remains well controlled. Most recent BMP satisfactory.  Wound on patient's right anterior lower leg has continued to heal well per the nurses report.  Patient continues to require some assistance with ADLs, as she is not able to ambulate independently as yet.   No Known Allergies Medications Reviewed   DATA REVIEWED  Radiologic Exams:    [facility] 07/06/2013 x-ray pelvis no obvious acute fractures of the pelvis can be seen  X-ray sacrum and coccyx: No definite acute bony abnormalities sacrococcygeal region can be seen minor irregularities noted S1 and S2 level believed to be degenerative  X-ray right hip buttocks to degenerative arthritic  changes right hip. No definite acute fractures the right hip  X-ray right femur: No definite acute bony abnormalities right femur can be  identified   [facility] 07/07/2013 MRI lumbar spine: IMPRESSION: 1.  Nondisplaced right sacral ala fracture.  Superimposed chronic central S2 sacral fracture.  Mild presacral edema in the pelvis. 2.  No acute traumatic injury identified in the lumbar or lower thoracic spine. 3.  Multifactorial moderate spinal and lateral recess stenosis at L3-L4 and L4-L5.  Moderate multifactorial left L5 S1 foraminal Stenosis.  07/19/2013 MRI sacrum without contrast colon. Impression bilateral sacral insufficiency fracture, with horizontal component through S2. The fractures were extensive than a lumbar spine MRI 07/08/2013. No displacement along the sacral foramina. Acute or unhealed right pupil acetabular junction fracture.   Laboratory Studies:    [facility] 07/18/2013 WBC 7.8, hemoglobin 11.0, hematocrit 32.8, platelets 336.  Glucose 94, BUN 23, creatinine 0.91, sodium 129, potassium 93.  08/01/2013 Glucose 97, BUN 20, creatinine 0.79, sodium 132, potassium 4.4      Review of Systems  DATA OBTAINED: from patient, nurse,  GENERAL:   No fevers, fatigue, change in appetite or weight SKIN: No itch, rash  EYES: No eye pain, dryness or itching  No change in vision EARS: No earache, tinnitus, change in hearing NOSE: No congestion, drainage or bleeding MOUTH/THROAT: No mouth or tooth pain  No sore throat No difficulty chewing or swallowing RESPIRATORY: No cough, wheezing, SOB CARDIAC: No chest pain, palpitations  No edema. GI: No abdominal pain  No N/V/D or constipation  No heartburn or reflux    GU: No dysuria, frequency or urgency  No change in urine volume or character  MUSCULOSKELETAL: No joint pain, swelling or stiffness   No Back pain NEUROLOGIC: No dizziness, fainting, headache. Less Numbness rt. Foot   No change in mental status.  PSYCHIATRIC: No feelings of anxiety, depression Sleeps well.      Physical Exam Filed Vitals:   08/25/13 1711  BP: 113/56  Pulse: 82  Weight: 131 lb  12.8 oz (59.784 kg)  SpO2: 96%  Body mass index is 23.35 kg/(m^2).  GENERAL APPEARANCE: No distress. Appropriately groomed, normal body habitus. Alert, pleasant, conversant. SKIN: No diaphoresis rash, unusual lesions. Superficial wound anterior right leg with thick loose eschar. Removed easily today revealing intact,  new skin underneath.   HEAD: Normocephalic, atraumatic EYES: Conjunctiva/lids clear.  EARS:Hearing grossly normal. NOSE: No deformity or discharge. RESPIRATORY: Breathing is even, unlabored.  MUSCULOSKELETAL: Moves all extremities FROM without pain. Mild rt. LE weakness and inability to flex rt. Foot straight NEUROLOGIC: Oriented to time, place, person. Speech clear, no tremor.  PSYCHIATRIC: Mood and affect appropriate to situation  ASSESSMENT/PLAN  Essential hypertension, benign Stable on current medications  Sacral fracture, closed Healing well, no pain or restriction of movement.  Hyponatremia Stable at last reading, repeat BMP next week  Right foot drop Ambulation has improved significantly since use of AFO. Patient continues to have weakness in the ankle and inability to fully dorsiflex the foot. She's working enthusiastically with physical therapy, making progress toward independent ambulation and goal of returning to her independent living home.  Anticipate discharge 09/07/2013  Wound, open, leg Healed   [place]  Follow up: As needed  Lab 08/29/13 BMP  Anatalia Kronk T.Kylen Ismael, NP-C 08/25/2013

## 2013-08-25 NOTE — Assessment & Plan Note (Signed)
Stable at last reading, repeat BMP next week

## 2013-08-25 NOTE — Assessment & Plan Note (Addendum)
Ambulation has improved significantly since use of AFO. Patient continues to have weakness in the ankle and inability to fully dorsiflex the foot. She's working enthusiastically with physical therapy, making progress toward independent ambulation and goal of returning to her independent living home.  Anticipate discharge 09/07/2013

## 2013-08-25 NOTE — Assessment & Plan Note (Signed)
Stable on current medications 

## 2013-09-01 ENCOUNTER — Encounter: Payer: Self-pay | Admitting: Geriatric Medicine

## 2013-09-01 ENCOUNTER — Non-Acute Institutional Stay (SKILLED_NURSING_FACILITY): Payer: Medicare Other | Admitting: Geriatric Medicine

## 2013-09-01 DIAGNOSIS — M21371 Foot drop, right foot: Secondary | ICD-10-CM

## 2013-09-01 DIAGNOSIS — M216X9 Other acquired deformities of unspecified foot: Secondary | ICD-10-CM

## 2013-09-01 DIAGNOSIS — S3210XD Unspecified fracture of sacrum, subsequent encounter for fracture with routine healing: Secondary | ICD-10-CM

## 2013-09-01 DIAGNOSIS — I10 Essential (primary) hypertension: Secondary | ICD-10-CM

## 2013-09-01 DIAGNOSIS — IMO0001 Reserved for inherently not codable concepts without codable children: Secondary | ICD-10-CM

## 2013-09-01 NOTE — Assessment & Plan Note (Signed)
Functional status is improving, patient is nearly independent with ambulation. Patient is to continue physical therapy and use of AFO. Patient is scheduled  to follow up with Dr. Lajoyce Corners on September 25.

## 2013-09-01 NOTE — Assessment & Plan Note (Signed)
Stable on current medications, does not require diuretic at this time. Most recent lab satisfactory.

## 2013-09-01 NOTE — Progress Notes (Signed)
Patient ID: [name], female   DOB: 03-23-1924, 79 y.o.   MRN: [id number] [facility] [phone])  Code Status: Living Will, DNR  Contact Information   Name Relation Home Work [place] Daughter [phone]  [phone]   Rush,Stacie Daughter [phone]  [phone]      Chief Complaint  Patient presents with  . Discharge Note    SNF discharge    HPI: This is a 79 y.o. female resident of [facility], Independent Living section. This patient had a fall 06/24/2013 while visiting in [place], [place]. She was evaluated at the orthopedic office on July 8 with a right hip x-ray that revealed no fracture. Pain progressed to involve her lower back, she returned for orthopedic office on July 16 had x-ray of her lower back that did not reveal any fracture. Patient was admitted to Rehab sectio at [facility] 07/06/13 due to severe right lower back and buttock pain, worse than any of the pain she's had. X-rays of hip, sacrum, pelvis completed 7/17 were without evidence of acute fracture. Pt's pain worsened, also developed rt. Foot numbness. Dr.Duda was called, recommend Hospital evaluation with MRI. This revealed a sacral fracture along with spinal stenosis L3-4, 4-5, L5-S1. Patient was discharged back to the skilled rehabilitation section of [facility] on 7/20 with instructions for pain management to include hydrocodone, Flexeril and prednisone taper. Physical therapy also recommended.  Patient continued to experience significant pain with any movement, was re-admitted to [facility] on 07/19/2013, repeat MRI showed increased edema in the sacral region with increased involvement of the sacral fracture. Interventional Radiology was consulted and recommended proceeding with Sacroplasty. During hospital workup patient was noted to have urinary tract infection, and so sacroplasty was postponed until 07/25/13.  Patient's pain was reasonably  well-controlled and she requested transfer back to [facility] rehabilitation section while waiting for her procedure. Sacroplasty was delayed further do to insurance issues, patient was able to undergo this procedure on Friday, 07/28/2013.   Patient has continued to recover from her sacral fracture very well, reports no pain in any position.   Ambulation remained impaired due to numbness and foot drop Rt. Foot, these symptoms are improving as well. Patient continues to have discomfort in her right foot especially in the evening, she continues to use one hydrocodone at bedtime to relieve this discomfort. Patient returned to Dr. Lajoyce Corners August 26, he increased the Neurontin to 300 mg twice a day. Patient has been enthusiastically working with physical therapy to avoid ankle contracture, gain general strength and gait stability. She is wearing  AFO during ambulation and a lambswool splint at other times.  Patient is nearly independent with ambulation with a rolling walker. She desires to use a four-wheel walker before going home. Therapy will work on this early next week.  Prior to hospitalization patient's HCTZ was placed on hold due to mild hyponatremia. This medication was stopped during hospitalization, amlodipine was increased to 10 mg. Patient's blood pressure remains well controlled. Most recent BMP satisfactory, supplemental KCl has been D/C.  Patient is nearly independent with ADLs and ambulation. Anticipate she should be ready for discharge to her independent living home Tuesday, September 16.   No Known Allergies    Medication List       This list is accurate as of: 09/01/13  6:11 PM.  Always use your most recent med list.               amLODipine 10 MG tablet  Commonly known as:  NORVASC  Take 1 tablet (10 mg total) by mouth daily.     aspirin EC 81 MG tablet  Take 81 mg by mouth daily.     beta carotene w/minerals tablet  Take 1 tablet by mouth daily.     CALCIUM 600 + D PO   Take 1 tablet by mouth daily.     DULoxetine 60 MG capsule  Commonly known as:  CYMBALTA  Take 60 mg by mouth daily.     ergocalciferol 50000 UNITS capsule  Commonly known as:  VITAMIN D2  Take 50,000 Units by mouth once a week. On saturday     gabapentin 300 MG capsule  Commonly known as:  NEURONTIN  Take 300 mg by mouth 2 (two) times daily.     HYDROcodone-acetaminophen 5-325 MG per tablet  Commonly known as:  NORCO/VICODIN  Take 1 tablet by mouth every 4 (four) hours as needed for pain. For pain     olmesartan 20 MG tablet  Commonly known as:  BENICAR  Take 20 mg by mouth daily.     polyethylene glycol packet  Commonly known as:  MIRALAX / GLYCOLAX  Take 17 g by mouth daily.     senna 8.6 MG Tabs tablet  Commonly known as:  SENOKOT  Take 1 tablet by mouth at bedtime as needed (for constipation).     simethicone 80 MG chewable tablet  Commonly known as:  MYLICON  Chew 80 mg by mouth every 6 (six) hours as needed for flatulence.        DATA REVIEWED  Radiologic Exams:    [facility] 07/06/2013 x-ray pelvis no obvious acute fractures of the pelvis can be seen  X-ray sacrum and coccyx: No definite acute bony abnormalities sacrococcygeal region can be seen minor irregularities noted S1 and S2 level believed to be degenerative  X-ray right hip buttocks to degenerative arthritic changes right hip. No definite acute fractures the right hip  X-ray right femur: No definite acute bony abnormalities right femur can be identified   [facility] 07/07/2013 MRI lumbar spine: IMPRESSION: 1.  Nondisplaced right sacral ala fracture.  Superimposed chronic central S2 sacral fracture.  Mild presacral edema in the pelvis. 2.  No acute traumatic injury identified in the lumbar or lower thoracic spine. 3.  Multifactorial moderate spinal and lateral recess stenosis at L3-L4 and L4-L5.  Moderate multifactorial left L5 S1 foraminal Stenosis.  07/19/2013 MRI sacrum without  contrast colon. Impression bilateral sacral insufficiency fracture, with horizontal component through S2. The fractures were extensive than a lumbar spine MRI 07/08/2013. No displacement along the sacral foramina. Acute or unhealed right pupil acetabular junction fracture.   Laboratory Studies:    [facility] 07/18/2013 WBC 7.8, hemoglobin 11.0, hematocrit 32.8, platelets 336.  Glucose 94, BUN 23, creatinine 0.91, sodium 129, potassium 93.  08/01/2013 Glucose 97, BUN 20, creatinine 0.79, sodium 132, potassium 4.4  08/29/2013 glucose 91, BUN 24, creatinine 0.89, sodium 136, potassium 4.4      Review of Systems  DATA OBTAINED: from patient, nurse,  GENERAL:   No fevers, fatigue, change in appetite or weight SKIN: No itch, rash  EYES: No eye pain, dryness or itching  No change in vision EARS: No earache, tinnitus, change in hearing NOSE: No congestion, drainage or bleeding MOUTH/THROAT: No mouth or tooth pain  No sore throat No difficulty chewing or swallowing RESPIRATORY: No cough, wheezing, SOB CARDIAC: No chest pain, palpitations  No edema. GI: No abdominal  pain  No N/V/D or constipation  No heartburn or reflux    GU: No dysuria, frequency or urgency  No change in urine volume or character  MUSCULOSKELETAL: No joint pain, swelling or stiffness   No Back pain NEUROLOGIC: No dizziness, fainting, headache. Less Numbness rt. Foot, sharp, stinging pain in evening (relieved with hydrocodone)  No change in mental status.  PSYCHIATRIC: No feelings of anxiety, depression Sleeps well.      Physical Exam Filed Vitals:   09/01/13 1806  BP: 105/62  Pulse: 82  Temp: 98.8 F (37.1 C)  Resp: 18  Weight: 131 lb 12.8 oz (59.784 kg)  SpO2: 96%  Body mass index is 23.35 kg/(m^2).  GENERAL APPEARANCE: No distress. Appropriately groomed, normal body habitus. Alert, pleasant, conversant. SKIN: No diaphoresis rash, unusual lesions, wounds   HEAD: Normocephalic, atraumatic EYES: Conjunctiva/lids  clear.  EARS:Hearing grossly normal. NOSE: No deformity or discharge. RESPIRATORY: Breathing is even, unlabored.  MUSCULOSKELETAL: Moves all extremities FROM without pain. Very mild rt. LE weakness, able to nearly flex rt. Foot straight NEUROLOGIC: Oriented to time, place, person. Speech clear, no tremor.  PSYCHIATRIC: Mood and affect appropriate to situation  ASSESSMENT/PLAN  Essential hypertension, benign Stable on current medications, does not require diuretic at this time. Most recent lab satisfactory.  Sacral fracture, closed Resolved  Right foot drop Functional status is improving, patient is nearly independent with ambulation. Patient is to continue physical therapy and use of AFO. Patient is scheduled  to follow up with Dr. Lajoyce Corners on September 25.   [place], continue current medication.  Recommend followup with primary care provider, Dr. Felipa Eth, about 2 weeks after discharge. Keep scheduled followup with Dr. Lajoyce Corners on September 25. 30 day prescriptions have been written for Neurontin and Hydrocodone   Stepheni Cameron T.Corina Stacy, NP-C 09/01/2013

## 2013-09-01 NOTE — Assessment & Plan Note (Signed)
Resolved

## 2014-12-12 ENCOUNTER — Other Ambulatory Visit (HOSPITAL_COMMUNITY): Payer: Self-pay | Admitting: Orthopedic Surgery

## 2014-12-18 ENCOUNTER — Other Ambulatory Visit (HOSPITAL_COMMUNITY): Payer: Self-pay | Admitting: Orthopedic Surgery

## 2014-12-24 ENCOUNTER — Encounter (HOSPITAL_COMMUNITY): Payer: Self-pay

## 2014-12-24 ENCOUNTER — Encounter (HOSPITAL_COMMUNITY)
Admission: RE | Admit: 2014-12-24 | Discharge: 2014-12-24 | Disposition: A | Discharge status: Home / Self Care | Payer: Medicare Other | Source: Ambulatory Visit | Attending: Orthopedic Surgery | Admitting: Orthopedic Surgery

## 2014-12-24 ENCOUNTER — Other Ambulatory Visit (HOSPITAL_COMMUNITY): Payer: Self-pay | Admitting: *Deleted

## 2014-12-24 HISTORY — DX: Nonrheumatic mitral (valve) prolapse: I34.1

## 2014-12-24 HISTORY — DX: Constipation, unspecified: K59.00

## 2014-12-24 HISTORY — DX: Gastro-esophageal reflux disease without esophagitis: K21.9

## 2014-12-24 HISTORY — DX: Pneumonia, unspecified organism: J18.9

## 2014-12-24 LAB — PROTIME-INR
INR: 0.97 (ref 0.00–1.49)
Prothrombin Time: 13 seconds (ref 11.6–15.2)

## 2014-12-24 LAB — SURGICAL PCR SCREEN
MRSA, PCR: NEGATIVE
STAPHYLOCOCCUS AUREUS: NEGATIVE

## 2014-12-24 LAB — APTT: aPTT: 30 seconds (ref 24–37)

## 2014-12-24 NOTE — Pre-Procedure Instructions (Signed)
[name]  12/24/2014   Your procedure is scheduled on:  Wednesday, January 02, 2015 at 8:30 AM.   Report to [facility] Entrance "A" Admitting Office at 6:30 AM.   Call this number if you have problems the morning of surgery: [phone]                Any questions prior to day of surgery, please call [phone] between 8 & 4 PM.    Remember:   Do not eat food or drink liquids after midnight Tuesday, 01/01/15.   Take these medicines the morning of surgery with A SIP OF WATER: amlodipine (Norvasc), Duloxetine (Cymbalta), Gabapentin (Neurontin), Hydrocodone - if needed  Stop Aspirin as of Wednesday, 12/26/14.  Do not take any NSAIDS (Ibuprofen, Aleve, etc.) 7 days prior to surgery.   Do not wear jewelry, make-up or nail polish.  Do not wear lotions, powders, or perfumes. You may wear deodorant.  Do not shave 48 hours prior to surgery.  Do not bring valuables to the hospital.  [facility] is not responsible                  for any belongings or valuables.               Contacts, dentures or bridgework may not be worn into surgery.  Leave suitcase in the car. After surgery it may be brought to your room.  For patients admitted to the hospital, discharge time is determined by your                treatment team.              Special Instructions: [place] - Preparing for Surgery  Before surgery, you can play an important role.  Because skin is not sterile, your skin needs to be as free of germs as possible.  You can reduce the number of germs on you skin by washing with CHG (chlorahexidine gluconate) soap before surgery.  CHG is an antiseptic cleaner which kills germs and bonds with the skin to continue killing germs even after washing.  Please DO NOT use if you have an allergy to CHG or antibacterial soaps.  If your skin becomes reddened/irritated stop using the CHG and inform your nurse when you arrive at Short Stay.  Do not shave (including legs and underarms) for at  least 48 hours prior to the first CHG shower.  You may shave your face.  Please follow these instructions carefully:   1.  Shower with CHG Soap the night before surgery and the                                morning of Surgery.  2.  If you choose to wash your hair, wash your hair first as usual with your       normal shampoo.  3.  After you shampoo, rinse your hair and body thoroughly to remove the                      Shampoo.  4.  Use CHG as you would any other liquid soap.  You can apply chg directly       to the skin and wash gently with scrungie or a clean washcloth.  5.  Apply the CHG Soap to your body ONLY FROM THE NECK DOWN.        Do  not use on open wounds or open sores.  Avoid contact with your eyes, ears, mouth and genitals (private parts).  Wash genitals (private parts) with your normal soap.  6.  Wash thoroughly, paying special attention to the area where your surgery        will be performed.  7.  Thoroughly rinse your body with warm water from the neck down.  8.  DO NOT shower/wash with your normal soap after using and rinsing off       the CHG Soap.  9.  Pat yourself dry with a clean towel.            10.  Wear clean pajamas.            11.  Place clean sheets on your bed the night of your first shower and do not        sleep with pets.  Day of Surgery  Do not apply any lotions the morning of surgery.  Please wear clean clothes to the hospital.     Please read over the following fact sheets that you were given: Pain Booklet, Coughing and Deep Breathing, MRSA Information and Surgical Site Infection Prevention

## 2014-12-24 NOTE — Progress Notes (Signed)
When pt came in for pre-admission appt she stated that her right hip had given away last pm and she fell. She states she is bruised on her left arm and leg. States she had the nurse at [facility] look at her this morning and the nurse felt that she might need sutures in her right lower leg. She wanted to know if we could take care of that for her here. I explained to her that we could not, but I would be glad to take her to the ED after her PAT appt. She didn't want to go to the ED and asked if I would call Dr. Vicente Males office and see if they would see her this afternoon. I called and spoke with Amy at Dr. Vicente Males office and she states that they are booked for the afternoon and that they were not equipped to put sutures in at their office. She suggested that pt go to the urgent care. I told this to pt and she was agreeable to it. I did take the dressing off and look at the wound and it looks like a skin tear approx 3 inches in length.   Pt also states that she had an EKG and CXR done at Dr. Vicente Males office last week and blood work done on 12/12/14. I called Amy at Dr. Vicente Males office and requested the results faxed to [place]. Amy states pt had CBC, CMET, TSH, and lipids drawn on the 23rd.

## 2015-01-01 MED ORDER — CEFAZOLIN SODIUM-DEXTROSE 2-3 GM-% IV SOLR
2.0000 g | INTRAVENOUS | Status: AC
  Administered 2015-01-02: 2 g via INTRAVENOUS
  Filled 2015-01-01: qty 50

## 2015-01-02 ENCOUNTER — Inpatient Hospital Stay (HOSPITAL_COMMUNITY): Payer: Medicare Other | Admitting: Certified Registered"

## 2015-01-02 ENCOUNTER — Encounter (HOSPITAL_COMMUNITY): Payer: Self-pay | Admitting: *Deleted

## 2015-01-02 ENCOUNTER — Encounter (HOSPITAL_COMMUNITY)
Admission: RE | Disposition: A | Discharge status: Skilled Nursing Facility | Payer: Self-pay | Source: Ambulatory Visit | Attending: Orthopedic Surgery

## 2015-01-02 ENCOUNTER — Inpatient Hospital Stay (HOSPITAL_COMMUNITY)
Admission: RE | Admit: 2015-01-02 | Discharge: 2015-01-04 | DRG: 470 | Disposition: A | Discharge status: Skilled Nursing Facility | Payer: Medicare Other | Source: Ambulatory Visit | Attending: Orthopedic Surgery | Admitting: Orthopedic Surgery

## 2015-01-02 DIAGNOSIS — Z79899 Other long term (current) drug therapy: Secondary | ICD-10-CM

## 2015-01-02 DIAGNOSIS — M25551 Pain in right hip: Secondary | ICD-10-CM | POA: Diagnosis present

## 2015-01-02 DIAGNOSIS — Z96651 Presence of right artificial knee joint: Secondary | ICD-10-CM | POA: Diagnosis present

## 2015-01-02 DIAGNOSIS — M1611 Unilateral primary osteoarthritis, right hip: Secondary | ICD-10-CM | POA: Diagnosis present

## 2015-01-02 DIAGNOSIS — Z96642 Presence of left artificial hip joint: Secondary | ICD-10-CM | POA: Diagnosis present

## 2015-01-02 DIAGNOSIS — M810 Age-related osteoporosis without current pathological fracture: Secondary | ICD-10-CM | POA: Diagnosis present

## 2015-01-02 DIAGNOSIS — Z7982 Long term (current) use of aspirin: Secondary | ICD-10-CM

## 2015-01-02 DIAGNOSIS — I10 Essential (primary) hypertension: Secondary | ICD-10-CM | POA: Diagnosis present

## 2015-01-02 DIAGNOSIS — Z96649 Presence of unspecified artificial hip joint: Secondary | ICD-10-CM

## 2015-01-02 DIAGNOSIS — Z87891 Personal history of nicotine dependence: Secondary | ICD-10-CM

## 2015-01-02 DIAGNOSIS — K219 Gastro-esophageal reflux disease without esophagitis: Secondary | ICD-10-CM | POA: Diagnosis present

## 2015-01-02 HISTORY — PX: TOTAL HIP ARTHROPLASTY: SHX124

## 2015-01-02 LAB — URINALYSIS, ROUTINE W REFLEX MICROSCOPIC
Bilirubin Urine: NEGATIVE
Glucose, UA: NEGATIVE mg/dL
Hgb urine dipstick: NEGATIVE
Ketones, ur: NEGATIVE mg/dL
LEUKOCYTES UA: NEGATIVE
NITRITE: NEGATIVE
Protein, ur: NEGATIVE mg/dL
SPECIFIC GRAVITY, URINE: 1.016 (ref 1.005–1.030)
UROBILINOGEN UA: 0.2 mg/dL (ref 0.0–1.0)
pH: 5.5 (ref 5.0–8.0)

## 2015-01-02 SURGERY — ARTHROPLASTY, HIP, TOTAL,POSTERIOR APPROACH
Anesthesia: General | Laterality: Right

## 2015-01-02 MED ORDER — HYDROMORPHONE HCL 1 MG/ML IJ SOLN
INTRAMUSCULAR | Status: AC
  Filled 2015-01-02: qty 1

## 2015-01-02 MED ORDER — PHENOL 1.4 % MT LIQD: 1.0000 | OROMUCOSAL | Status: DC | PRN

## 2015-01-02 MED ORDER — METOCLOPRAMIDE HCL 10 MG PO TABS: 5.0000 mg | ORAL_TABLET | Freq: Three times a day (TID) | ORAL | Status: DC | PRN

## 2015-01-02 MED ORDER — GLYCOPYRROLATE 0.2 MG/ML IJ SOLN
INTRAMUSCULAR | Status: DC | PRN
  Administered 2015-01-02: 0.6 mg via INTRAVENOUS

## 2015-01-02 MED ORDER — ONDANSETRON HCL 4 MG/2ML IJ SOLN
INTRAMUSCULAR | Status: DC | PRN
  Administered 2015-01-02: 4 mg via INTRAVENOUS

## 2015-01-02 MED ORDER — PROPOFOL 10 MG/ML IV BOLUS
INTRAVENOUS | Status: DC | PRN
  Administered 2015-01-02: 80 mg via INTRAVENOUS

## 2015-01-02 MED ORDER — SODIUM CHLORIDE 0.9 % IV SOLN: INTRAVENOUS | Status: DC

## 2015-01-02 MED ORDER — MENTHOL 3 MG MT LOZG: 1.0000 | LOZENGE | OROMUCOSAL | Status: DC | PRN

## 2015-01-02 MED ORDER — ONDANSETRON HCL 4 MG/2ML IJ SOLN
INTRAMUSCULAR | Status: AC
  Filled 2015-01-02: qty 2

## 2015-01-02 MED ORDER — ASPIRIN EC 325 MG PO TBEC
325.0000 mg | DELAYED_RELEASE_TABLET | Freq: Every day | ORAL | Status: DC
  Filled 2015-01-02 (×3): qty 1

## 2015-01-02 MED ORDER — METHOCARBAMOL 500 MG PO TABS
500.0000 mg | ORAL_TABLET | Freq: Four times a day (QID) | ORAL | Status: DC | PRN
  Filled 2015-01-02: qty 1

## 2015-01-02 MED ORDER — ROCURONIUM BROMIDE 100 MG/10ML IV SOLN
INTRAVENOUS | Status: DC | PRN
  Administered 2015-01-02: 35 mg via INTRAVENOUS

## 2015-01-02 MED ORDER — HYDROMORPHONE HCL 1 MG/ML IJ SOLN
0.2500 mg | INTRAMUSCULAR | Status: AC | PRN
  Administered 2015-01-02 (×8): 0.25 mg via INTRAVENOUS

## 2015-01-02 MED ORDER — OXYCODONE HCL 5 MG PO TABS
5.0000 mg | ORAL_TABLET | ORAL | Status: DC | PRN
  Administered 2015-01-02: 10 mg via ORAL
  Administered 2015-01-02 – 2015-01-03 (×2): 5 mg via ORAL
  Administered 2015-01-03 (×2): 10 mg via ORAL
  Administered 2015-01-03: 5 mg via ORAL
  Filled 2015-01-02 (×4): qty 2
  Filled 2015-01-02: qty 1
  Filled 2015-01-02: qty 2

## 2015-01-02 MED ORDER — PROMETHAZINE HCL 25 MG/ML IJ SOLN: 6.2500 mg | INTRAMUSCULAR | Status: DC | PRN

## 2015-01-02 MED ORDER — PROPOFOL 10 MG/ML IV BOLUS
INTRAVENOUS | Status: AC
  Filled 2015-01-02: qty 20

## 2015-01-02 MED ORDER — FERROUS SULFATE 325 (65 FE) MG PO TABS
325.0000 mg | ORAL_TABLET | Freq: Three times a day (TID) | ORAL | Status: DC
  Administered 2015-01-02 – 2015-01-04 (×4): 325 mg via ORAL
  Filled 2015-01-02 (×8): qty 1

## 2015-01-02 MED ORDER — METHOCARBAMOL 1000 MG/10ML IJ SOLN
500.0000 mg | Freq: Four times a day (QID) | INTRAVENOUS | Status: DC | PRN
  Filled 2015-01-02: qty 5

## 2015-01-02 MED ORDER — ACETAMINOPHEN 650 MG RE SUPP: 650.0000 mg | Freq: Four times a day (QID) | RECTAL | Status: DC | PRN

## 2015-01-02 MED ORDER — HYDROMORPHONE HCL 1 MG/ML IJ SOLN: 1.0000 mg | INTRAMUSCULAR | Status: DC | PRN

## 2015-01-02 MED ORDER — ALUM & MAG HYDROXIDE-SIMETH 200-200-20 MG/5ML PO SUSP: 30.0000 mL | ORAL | Status: DC | PRN

## 2015-01-02 MED ORDER — ARTIFICIAL TEARS OP OINT
TOPICAL_OINTMENT | OPHTHALMIC | Status: AC
  Filled 2015-01-02: qty 3.5

## 2015-01-02 MED ORDER — LIDOCAINE HCL (CARDIAC) 20 MG/ML IV SOLN
INTRAVENOUS | Status: DC | PRN
  Administered 2015-01-02: 70 mg via INTRAVENOUS

## 2015-01-02 MED ORDER — METOCLOPRAMIDE HCL 5 MG/ML IJ SOLN: 5.0000 mg | Freq: Three times a day (TID) | INTRAMUSCULAR | Status: DC | PRN

## 2015-01-02 MED ORDER — ONDANSETRON HCL 4 MG PO TABS: 4.0000 mg | ORAL_TABLET | Freq: Four times a day (QID) | ORAL | Status: DC | PRN

## 2015-01-02 MED ORDER — DOCUSATE SODIUM 100 MG PO CAPS
100.0000 mg | ORAL_CAPSULE | Freq: Two times a day (BID) | ORAL | Status: DC
  Administered 2015-01-02 – 2015-01-04 (×4): 100 mg via ORAL
  Filled 2015-01-02 (×5): qty 1

## 2015-01-02 MED ORDER — CEFAZOLIN SODIUM 1-5 GM-% IV SOLN
1.0000 g | Freq: Four times a day (QID) | INTRAVENOUS | Status: AC
  Administered 2015-01-02 (×2): 1 g via INTRAVENOUS
  Filled 2015-01-02 (×3): qty 50

## 2015-01-02 MED ORDER — ROCURONIUM BROMIDE 50 MG/5ML IV SOLN
INTRAVENOUS | Status: AC
  Filled 2015-01-02: qty 1

## 2015-01-02 MED ORDER — BISACODYL 5 MG PO TBEC: 5.0000 mg | DELAYED_RELEASE_TABLET | Freq: Every day | ORAL | Status: DC | PRN

## 2015-01-02 MED ORDER — DEXTROSE 5 % IV SOLN
500.0000 mg | INTRAVENOUS | Status: AC
  Administered 2015-01-02: 500 mg via INTRAVENOUS
  Filled 2015-01-02: qty 5

## 2015-01-02 MED ORDER — FENTANYL CITRATE 0.05 MG/ML IJ SOLN
INTRAMUSCULAR | Status: DC | PRN
  Administered 2015-01-02 (×3): 50 ug via INTRAVENOUS

## 2015-01-02 MED ORDER — LACTATED RINGERS IV SOLN
INTRAVENOUS | Status: DC | PRN
  Administered 2015-01-02 (×2): via INTRAVENOUS

## 2015-01-02 MED ORDER — MAGNESIUM CITRATE PO SOLN: 1.0000 | Freq: Once | ORAL | Status: AC | PRN

## 2015-01-02 MED ORDER — ARTIFICIAL TEARS OP OINT
TOPICAL_OINTMENT | OPHTHALMIC | Status: DC | PRN
  Administered 2015-01-02: 1 via OPHTHALMIC

## 2015-01-02 MED ORDER — NEOSTIGMINE METHYLSULFATE 10 MG/10ML IV SOLN
INTRAVENOUS | Status: DC | PRN
Start: 2015-01-02 — End: 2015-01-02
  Administered 2015-01-02: 4 mg via INTRAVENOUS

## 2015-01-02 MED ORDER — OXYCODONE HCL 5 MG PO TABS
ORAL_TABLET | ORAL | Status: AC
  Filled 2015-01-02: qty 1

## 2015-01-02 MED ORDER — FENTANYL CITRATE 0.05 MG/ML IJ SOLN
INTRAMUSCULAR | Status: AC
  Filled 2015-01-02: qty 5

## 2015-01-02 MED ORDER — DIPHENHYDRAMINE HCL 12.5 MG/5ML PO ELIX: 12.5000 mg | ORAL_SOLUTION | ORAL | Status: DC | PRN

## 2015-01-02 MED ORDER — 0.9 % SODIUM CHLORIDE (POUR BTL) OPTIME
TOPICAL | Status: DC | PRN
  Administered 2015-01-02: 1000 mL

## 2015-01-02 MED ORDER — ACETAMINOPHEN 325 MG PO TABS: 650.0000 mg | ORAL_TABLET | Freq: Four times a day (QID) | ORAL | Status: DC | PRN

## 2015-01-02 MED ORDER — ONDANSETRON HCL 4 MG/2ML IJ SOLN: 4.0000 mg | Freq: Four times a day (QID) | INTRAMUSCULAR | Status: DC | PRN

## 2015-01-02 MED ORDER — CHLORHEXIDINE GLUCONATE 4 % EX LIQD: 60.0000 mL | Freq: Once | CUTANEOUS | Status: DC

## 2015-01-02 MED ORDER — SENNOSIDES-DOCUSATE SODIUM 8.6-50 MG PO TABS: 1.0000 | ORAL_TABLET | Freq: Every evening | ORAL | Status: DC | PRN

## 2015-01-02 SURGICAL SUPPLY — 52 items
BLADE SAW SAG 73X25 THK (BLADE) ×2
BLADE SAW SGTL 73X25 THK (BLADE) ×1 IMPLANT
BLADE SURG 10 STRL SS (BLADE) IMPLANT
BLADE SURG 21 STRL SS (BLADE) ×3 IMPLANT
BRUSH FEMORAL CANAL (MISCELLANEOUS) IMPLANT
CAPT HIP TOTAL 2 ×3 IMPLANT
COVER BACK TABLE 24X17X13 BIG (DRAPES) IMPLANT
COVER SURGICAL LIGHT HANDLE (MISCELLANEOUS) ×3 IMPLANT
DRAPE IMP U-DRAPE 54X76 (DRAPES) ×3 IMPLANT
DRAPE INCISE IOBAN 85X60 (DRAPES) ×3 IMPLANT
DRAPE ORTHO SPLIT 77X108 STRL (DRAPES) ×4
DRAPE SURG ORHT 6 SPLT 77X108 (DRAPES) ×2 IMPLANT
DRAPE U-SHAPE 47X51 STRL (DRAPES) ×3 IMPLANT
DRSG MEPILEX BORDER 4X12 (GAUZE/BANDAGES/DRESSINGS) ×3 IMPLANT
DRSG MEPILEX BORDER 4X8 (GAUZE/BANDAGES/DRESSINGS) IMPLANT
DURAPREP 26ML APPLICATOR (WOUND CARE) ×3 IMPLANT
ELECT BLADE 4.0 EZ CLEAN MEGAD (MISCELLANEOUS) ×3
ELECT BLADE 6.5 EXT (BLADE) IMPLANT
ELECT CAUTERY BLADE 6.4 (BLADE) ×3 IMPLANT
ELECT REM PT RETURN 9FT ADLT (ELECTROSURGICAL) ×3
ELECTRODE BLDE 4.0 EZ CLN MEGD (MISCELLANEOUS) ×1 IMPLANT
ELECTRODE REM PT RTRN 9FT ADLT (ELECTROSURGICAL) ×1 IMPLANT
GLOVE BIOGEL PI IND STRL 9 (GLOVE) ×1 IMPLANT
GLOVE BIOGEL PI INDICATOR 9 (GLOVE) ×2
GLOVE SURG ORTHO 9.0 STRL STRW (GLOVE) ×3 IMPLANT
GOWN STRL REUS W/ TWL LRG LVL3 (GOWN DISPOSABLE) ×1 IMPLANT
GOWN STRL REUS W/ TWL XL LVL3 (GOWN DISPOSABLE) ×2 IMPLANT
GOWN STRL REUS W/TWL LRG LVL3 (GOWN DISPOSABLE) ×2
GOWN STRL REUS W/TWL XL LVL3 (GOWN DISPOSABLE) ×4
HANDPIECE INTERPULSE COAX TIP (DISPOSABLE)
HIP CAPITATED TOTAL 2 ×1 IMPLANT
KIT BASIN OR (CUSTOM PROCEDURE TRAY) ×3 IMPLANT
KIT ROOM TURNOVER OR (KITS) ×3 IMPLANT
MANIFOLD NEPTUNE II (INSTRUMENTS) ×3 IMPLANT
NS IRRIG 1000ML POUR BTL (IV SOLUTION) ×3 IMPLANT
PACK TOTAL JOINT (CUSTOM PROCEDURE TRAY) ×3 IMPLANT
PACK UNIVERSAL I (CUSTOM PROCEDURE TRAY) ×3 IMPLANT
PAD ARMBOARD 7.5X6 YLW CONV (MISCELLANEOUS) ×6 IMPLANT
PRESSURIZER FEMORAL UNIV (MISCELLANEOUS) IMPLANT
SET HNDPC FAN SPRY TIP SCT (DISPOSABLE) IMPLANT
STAPLER VISISTAT 35W (STAPLE) IMPLANT
SUT ETHIBOND NAB CT1 #1 30IN (SUTURE) ×6 IMPLANT
SUT VIC AB 0 CT1 27 (SUTURE)
SUT VIC AB 0 CT1 27XBRD ANBCTR (SUTURE) IMPLANT
SUT VIC AB 1 CTX 36 (SUTURE) ×2
SUT VIC AB 1 CTX36XBRD ANBCTR (SUTURE) ×1 IMPLANT
SUT VIC AB 2-0 CTB1 (SUTURE) ×3 IMPLANT
TOWEL OR 17X24 6PK STRL BLUE (TOWEL DISPOSABLE) ×3 IMPLANT
TOWEL OR 17X26 10 PK STRL BLUE (TOWEL DISPOSABLE) ×3 IMPLANT
TOWER CARTRIDGE SMART MIX (DISPOSABLE) IMPLANT
TRAY FOLEY CATH 16FRSI W/METER (SET/KITS/TRAYS/PACK) IMPLANT
WATER STERILE IRR 1000ML POUR (IV SOLUTION) IMPLANT

## 2015-01-02 NOTE — Progress Notes (Signed)
Utilization review completed.  

## 2015-01-02 NOTE — Op Note (Signed)
01/02/2015  9:49 AM  PATIENT:  [name]    PRE-OPERATIVE DIAGNOSIS:  Osteoarthritis Right Hip  POST-OPERATIVE DIAGNOSIS:  Same  PROCEDURE:  TOTAL HIP ARTHROPLASTY  SURGEON:  Nadara MustardUDA,MARCUS V, MD  PHYSICIAN ASSISTANT:None ANESTHESIA:   General  PREOPERATIVE INDICATIONS:  [name] is a  81 y.o. female with a diagnosis of Osteoarthritis Right Hip who failed conservative measures and elected for surgical management.    The risks benefits and alternatives were discussed with the patient preoperatively including but not limited to the risks of infection, bleeding, nerve injury, cardiopulmonary complications, the need for revision surgery, among others, and the patient was willing to proceed.  OPERATIVE IMPLANTS: Zimmer implants size 52 mm acetabulum. Size 36 mm head. Size 11 stem. 10 polyethylene liner.  OPERATIVE FINDINGS: Stable total hip with full range of motion leg lengths equal.  OPERATIVE PROCEDURE: Patient was brought to the operating room and underwent a general anesthetic. After adequate levels of anesthesia were obtained patient was placed in the left lateral decubitus position with the right side up and the right lower extremity was prepped using DuraPrep draped into a sterile field an [place] was used to cover all exposed skin. A timeout was called. A posterior lateral incision was made this was carried down through the tensor fascia lata which was split. The piriformis short external rotators and capsule were retracted off the femoral neck retractor with Ethibond suture. The femoral neck cut was made 1 cm proximal to the calcar. The acetabulum was sequentially reamed to a size 52 mm 452 mm acetabulum. This was inserted with 45 of abduction and 20 of anteversion. A 20 liner was inserted. Attention was then focused on the femur. The femur was sequentially broached to a size 11 femur. The final implant was secured this was trialed with different necks and the anteverted +0 neck  was stable. The wound was irrigated with normal saline throughout the case. The anteverted neck plus the 36 mm head was inserted the hip was finally reduced and placed a full range of motion and was stable. The piriformis short external rotators and capsules were reapproximated using #1 Ethibond. The tensor fascia lata was closed using #1 Vicryl. Subcutaneous is closed using 0 Vicryl. Skin was closed using staples. A Mepilex dressing was applied. Patient was extubated taken to the PACU in stable condition.

## 2015-01-02 NOTE — Evaluation (Signed)
Physical Therapy Evaluation Patient Details Name: [name] MRN: [id number] DOB: 07-14-1924 Today's Date: 01/02/2015   History of Present Illness  Pt is an 81 yo female admitted 1/13 for R THA.  Clinical Impression  Pt with increased sleepiness limiting ability to actively participate in exercises and limiting recall of post hip precautions. Pt requires assist x2 for all transfers. Pt requested water and was unable to effectively swallow x 3 trials. RN made aware is requesting swallow eval by SLP. Pt to benefit from ST-SNF to achieve safe mod I level of function for transition back to indep living.    Follow Up Recommendations SNF;Supervision/Assistance - 24 hour    Equipment Recommendations  None recommended by PT    Recommendations for Other Services Speech consult     Precautions / Restrictions Precautions Precautions: Posterior Hip;Fall Precaution Booklet Issued: Yes (comment) Precaution Comments: educated pt however poor carry over due to increased sleepiness Restrictions Weight Bearing Restrictions: Yes RLE Weight Bearing: Weight bearing as tolerated      Mobility  Bed Mobility Overal bed mobility: Needs Assistance;+2 for physical assistance Bed Mobility: Supine to Sit     Supine to sit: Mod assist;+2 for physical assistance     General bed mobility comments: due to lethargy pt with difficulty following directions and staying on task, assist for LE management and trunk elevation  Transfers Overall transfer level: Needs assistance Equipment used: Rolling walker (2 wheeled) Transfers: Sit to/from Stand Sit to Stand: Mod assist;+2 physical assistance         General transfer comment: max directional v/c's, pt had to pull up on PT and tech, was unable to push from bed  Ambulation/Gait             General Gait Details: unable to tolerate this date  Stairs            Wheelchair Mobility    Modified Rankin (Stroke Patients Only)        Balance Overall balance assessment: History of Falls;Needs assistance         Standing balance support: Bilateral upper extremity supported Standing balance-Leahy Scale: Poor Standing balance comment: posterior lean despite max tactile cues to achieve upright posture                             Pertinent Vitals/Pain Pain Assessment: 0-10 Pain Score: 6  Pain Location: R hip in standing, 0/10 at rest Pain Intervention(s): Monitored during session    Home Living Family/patient expects to be discharged to:: Other (Comment) Interior and spatial designer)                 Additional Comments: rehab at [facility], pt lives in indep living at [facility]    Prior Function Level of Independence: Independent with assistive device(s)         Comments: used SPC and RW PTA     Hand Dominance   Dominant Hand: Right    Extremity/Trunk Assessment   Upper Extremity Assessment: Generalized weakness           Lower Extremity Assessment: RLE deficits/detail RLE Deficits / Details: pt able to complete LAQ and initiate hip flex in bed    Cervical / Trunk Assessment: Kyphotic  Communication   Communication: No difficulties  Cognition Arousal/Alertness: Suspect due to medications;Lethargic Behavior During Therapy: WFL for tasks assessed/performed Overall Cognitive Status: Difficult to assess  General Comments      Exercises        Assessment/Plan    PT Assessment Patient needs continued PT services  PT Diagnosis Difficulty walking;Acute pain   PT Problem List Decreased strength;Decreased range of motion;Decreased activity tolerance;Decreased knowledge of use of DME;Decreased balance  PT Treatment Interventions DME instruction;Gait training;Functional mobility training;Therapeutic activities;Therapeutic exercise   PT Goals (Current goals can be found in the Care Plan section) Acute Rehab PT Goals Patient Stated Goal: rehab PT Goal  Formulation: With patient Time For Goal Achievement: 01/09/15 Potential to Achieve Goals: Good    Frequency 7X/week   Barriers to discharge Decreased caregiver support lives alone    Co-evaluation               End of Session Equipment Utilized During Treatment: Gait belt Activity Tolerance: Patient tolerated treatment well Patient left: in bed;with call bell/phone within reach Nurse Communication: Mobility status (difficulty swallowing)         Time: 6213-0865 PT Time Calculation (min) (ACUTE ONLY): 29 min   Charges:   PT Evaluation $Initial PT Evaluation Tier I: 1 Procedure PT Treatments $Therapeutic Activity: 8-22 mins   PT G CodesMarcene Brawn 01/02/2015, 4:51 PM  Lewis Shock, PT, DPT Pager #: [phone] Office #: [phone]

## 2015-01-02 NOTE — Progress Notes (Signed)
OT Cancellation Note  Patient Details Name: Arlet SwazilandJordan MRN: [id number] DOB: [birth year]   Cancelled Treatment:    Reason Eval/Treat Not Completed: Other (comment) Pt is Medicare and current D/C plan is SNF. No apparent immediate acute care OT needs, therefore will defer OT to SNF. If OT eval is needed please call Acute Rehab Dept. at [phone] or text page OT at [phone].  [facility],HILLARY  Treyvion Durkee, OTR/L  [phone] 01/02/2015 01/02/2015, 9:07 PM

## 2015-01-02 NOTE — Anesthesia Postprocedure Evaluation (Signed)
  Anesthesia Post-op Note  Patient: [name]  Procedure(s) Performed: Procedure(s): TOTAL HIP ARTHROPLASTY (Right)  Patient Location: PACU  Anesthesia Type:General  Level of Consciousness: awake and alert   Airway and Oxygen Therapy: Patient Spontanous Breathing  Post-op Pain: mild  Post-op Assessment: Post-op Vital signs reviewed and Patient's Cardiovascular Status Stable  Post-op Vital Signs: stable  Last Vitals:  Filed Vitals:   01/02/15 1249  BP: 116/48  Pulse: 79  Temp: 36.5 C  Resp: 14    Complications: No apparent anesthesia complications

## 2015-01-02 NOTE — Transfer of Care (Signed)
Immediate Anesthesia Transfer of Care Note  Patient: [name]  Procedure(s) Performed: Procedure(s): TOTAL HIP ARTHROPLASTY (Right)  Patient Location: PACU  Anesthesia Type:General  Level of Consciousness: awake  Airway & Oxygen Therapy: Patient Spontanous Breathing and Patient connected to nasal cannula oxygen  Post-op Assessment: Report given to PACU RN  Post vital signs: Reviewed and stable  Complications: No apparent anesthesia complications

## 2015-01-02 NOTE — H&P (Signed)
TOTAL HIP ADMISSION H&P  Patient is admitted for right total hip arthroplasty.  Subjective:  Chief Complaint: right hip pain  HPI: [name], 81 y.o. female, has a history of pain and functional disability in the right hip(s) due to arthritis and patient has failed non-surgical conservative treatments for greater than 12 weeks to include NSAID's and/or analgesics, use of assistive devices and activity modification.  Onset of symptoms was gradual starting 8 years ago with gradually worsening course since that time.The patient noted no past surgery on the right hip(s).  Patient currently rates pain in the right hip at 8 out of 10 with activity. Patient has night pain, worsening of pain with activity and weight bearing, trendelenberg gait, pain that interfers with activities of daily living, pain with passive range of motion, crepitus and joint swelling. Patient has evidence of subchondral cysts, subchondral sclerosis, periarticular osteophytes and joint space narrowing by imaging studies. This condition presents safety issues increasing the risk of falls. This patient has had avascular necrosis of the hip, acetabular fracture, hip dysplasia.  There is no current active infection.  Patient Active Problem List   Diagnosis Date Noted  . Right foot drop 08/08/2013  . Low back pain 07/20/2013  . Hyponatremia 07/19/2013  . Essential hypertension, benign   . Sacral fracture, closed 07/08/2013  . Right hip pain 07/08/2013  . Unspecified constipation 07/08/2013  . Spinal stenosis of lumbar region 07/08/2013   Past Medical History  Diagnosis Date  . Arthritis   . Osteoporosis   . Essential hypertension, benign   . Osteoarthrosis, unspecified whether generalized or localized, unspecified site   . Other malaise and fatigue     re: stress, anxiety  . Allergic rhinitis, cause unspecified   . Osteoporosis/osteopenia increased risk   . Macular degeneration   . Venous insufficiency   . Cataract  cortical, senile 2010    s/p bil extract/IOL implants   . Degenerative disk disease 06/2013  . Spinal stenosis of lumbar region 07/08/2013  . Hyponatremia 07/19/2013  . Right foot drop 08/08/2013  . Mitral valve prolapse   . Pneumonia     as an infant  . GERD (gastroesophageal reflux disease)   . Constipation     Past Surgical History  Procedure Laterality Date  . Total hip arthroplasty Left 2005  . Replacement total knee Right 2010  . Tonsillectomy    . Colonoscopy    . Eye surgery Bilateral     cataract surgery with lens implants    No prescriptions prior to admission   No Known Allergies  History  Substance Use Topics  . Smoking status: Former Smoker    Quit date: 07/07/1963  . Smokeless tobacco: Never Used  . Alcohol Use: 8.4 oz/week    14 Shots of liquor per week     Comment: drinks scotch daily.     Family History  Problem Relation Age of Onset  . Esophageal cancer Mother [phone]  . Breast cancer Daughter   . Thyroid disease Daughter      Review of Systems  All other systems reviewed and are negative.   Objective:  Physical Exam  Vital signs in last 24 hours:    Labs:   Estimated body mass index is 23.35 kg/(m^2) as calculated from the following:   Height as of 07/28/13: 5\' 3"  (1.6 m).   Weight as of 09/01/13: 59.784 kg (131 lb 12.8 oz).   Imaging Review Plain radiographs demonstrate moderate degenerative joint disease of the right  hip(s). The bone quality appears to be adequate for age and reported activity level.  Assessment/Plan:  End stage arthritis, right hip(s)  The patient history, physical examination, clinical judgement of the provider and imaging studies are consistent with end stage degenerative joint disease of the right hip(s) and total hip arthroplasty is deemed medically necessary. The treatment options including medical management, injection therapy, arthroscopy and arthroplasty were discussed at length. The risks and benefits of total hip  arthroplasty were presented and reviewed. The risks due to aseptic loosening, infection, stiffness, dislocation/subluxation,  thromboembolic complications and other imponderables were discussed.  The patient acknowledged the explanation, agreed to proceed with the plan and consent was signed. Patient is being admitted for inpatient treatment for surgery, pain control, PT, OT, prophylactic antibiotics, VTE prophylaxis, progressive ambulation and ADL's and discharge planning.The patient is planning to be discharged to skilled nursing facility

## 2015-01-02 NOTE — Anesthesia Preprocedure Evaluation (Addendum)
Anesthesia Evaluation  Patient identified by MRN, date of birth, ID band  Reviewed: Allergy & Precautions, NPO status , Patient's Chart, lab work & pertinent test results  Airway Mallampati: II  TM Distance: <3 FB Neck ROM: Full    Dental  (+) Teeth Intact, Dental Advisory Given   Pulmonary former smoker,  breath sounds clear to auscultation        Cardiovascular hypertension, Pt. on medications + Peripheral Vascular Disease Rhythm:Regular Rate:Normal     Neuro/Psych    GI/Hepatic GERD-  Medicated and Controlled,  Endo/Other    Renal/GU      Musculoskeletal  (+) Arthritis -, Osteoarthritis,    Abdominal   Peds  Hematology   Anesthesia Other Findings   Reproductive/Obstetrics                            Anesthesia Physical Anesthesia Plan  ASA: II  Anesthesia Plan: General   Post-op Pain Management:    Induction: Intravenous  Airway Management Planned: Oral ETT  Additional Equipment:   Intra-op Plan:   Post-operative Plan: Extubation in OR  Informed Consent: I have reviewed the patients History and Physical, chart, labs and discussed the procedure including the risks, benefits and alternatives for the proposed anesthesia with the patient or authorized representative who has indicated his/her understanding and acceptance.     Plan Discussed with:   Anesthesia Plan Comments:         Anesthesia Quick Evaluation

## 2015-01-02 NOTE — Anesthesia Procedure Notes (Signed)
Procedure Name: Intubation Date/Time: 01/02/2015 8:46 AM Performed by: De NurseENNIE, Jamielyn Petrucci E Pre-anesthesia Checklist: Patient identified, Emergency Drugs available, Suction available, Patient being monitored and Timeout performed Patient Re-evaluated:Patient Re-evaluated prior to inductionOxygen Delivery Method: Circle system utilized Preoxygenation: Pre-oxygenation with 100% oxygen Intubation Type: IV induction Ventilation: Mask ventilation without difficulty and Oral airway inserted - appropriate to patient size Laryngoscope Size: Glidescope Grade View: Grade I Tube type: Oral Tube size: 7.0 mm Number of attempts: 3 Airway Equipment and Method: Stylet and Video-laryngoscopy Placement Confirmation: ETT inserted through vocal cords under direct vision,  positive ETCO2 and breath sounds checked- equal and bilateral Secured at: 21 cm Tube secured with: Tape Dental Injury: Teeth and Oropharynx as per pre-operative assessment  Difficulty Due To: Difficulty was unanticipated and Difficult Airway- due to immobile epiglottis Future Recommendations: Recommend- induction with short-acting agent, and alternative techniques readily available Comments: DL x 1 Grade 2 view with MAC 3 by CRNA, unable to insert ETT, DL x 1 with Hyacinth MeekerMiller 2 by anesthiologist, uable to insert ETT, Glidescope used on successful third attempt, grade 1 view.

## 2015-01-03 LAB — BASIC METABOLIC PANEL
ANION GAP: 13 (ref 5–15)
BUN: 22 mg/dL (ref 6–23)
CO2: 26 mmol/L (ref 19–32)
Calcium: 8 mg/dL — ABNORMAL LOW (ref 8.4–10.5)
Chloride: 87 mEq/L — ABNORMAL LOW (ref 96–112)
Creatinine, Ser: 1.28 mg/dL — ABNORMAL HIGH (ref 0.50–1.10)
GFR calc Af Amer: 42 mL/min — ABNORMAL LOW (ref >90)
GFR calc non Af Amer: 36 mL/min — ABNORMAL LOW (ref >90)
GLUCOSE: 111 mg/dL — AB (ref 70–99)
POTASSIUM: 4.1 mmol/L (ref 3.5–5.1)
Sodium: 126 mmol/L — ABNORMAL LOW (ref 135–145)

## 2015-01-03 LAB — CBC
HEMATOCRIT: 22.4 % — AB (ref 36.0–46.0)
Hemoglobin: 7.8 g/dL — ABNORMAL LOW (ref 12.0–15.0)
MCH: 32.4 pg (ref 26.0–34.0)
MCHC: 34.8 g/dL (ref 30.0–36.0)
MCV: 92.9 fL (ref 78.0–100.0)
PLATELETS: 204 10*3/uL (ref 150–400)
RBC: 2.41 MIL/uL — AB (ref 3.87–5.11)
RDW: 13.4 % (ref 11.5–15.5)
WBC: 5.4 10*3/uL (ref 4.0–10.5)

## 2015-01-03 MED ORDER — RESOURCE THICKENUP CLEAR PO POWD
ORAL | Status: DC | PRN
  Filled 2015-01-03: qty 125

## 2015-01-03 NOTE — Progress Notes (Signed)
Physical Therapy Treatment Patient Details Name: [name] MRN: [id number] DOB: March 14, 1924 Today's Date: 01/03/2015    History of Present Illness Pt is an 81 yo female admitted 1/13 for R THA.    PT Comments    Pt was seen for an early afternoon visit due to having bladder distension and being unable to void, nursing in to do in/out cath all morning.  Has a limited tolerance for standing but is willing to try.  PT and pt discuss her options with tx and she is willing also to try to scoot up bed, but finally needed 2 max to get back.  Follow Up Recommendations  SNF;Supervision/Assistance - 24 hour     Equipment Recommendations  None recommended by PT    Recommendations for Other Services       Precautions / Restrictions Precautions Precautions: Posterior Hip;Fall Precaution Booklet Issued: Yes (comment) Precaution Comments: educated pt however poor carry over even with prompting within session Restrictions Weight Bearing Restrictions: Yes RLE Weight Bearing: Weight bearing as tolerated    Mobility  Bed Mobility Overal bed mobility: Needs Assistance;+2 for physical assistance Bed Mobility: Supine to Sit;Sit to Supine     Supine to sit: Max assist Sit to supine: Max assist;+2 for physical assistance   General bed mobility comments: Pt is reluctant to try to move but did lift her head somewhat, then a little effort to move LLE  Transfers Overall transfer level: Needs assistance Equipment used: Rolling walker (2 wheeled);2 person hand held assist Transfers: Sit to/from Stand Sit to Stand: Max assist;+2 physical assistance         General transfer comment: dense repetition of cues for sequence and hand placement, assistance to keep feet on floor as pt did not follow directions to touch her feet to floor in sitting  Ambulation/Gait             General Gait Details: unable   Stairs            Wheelchair Mobility    Modified Rankin (Stroke Patients  Only)       Balance Overall balance assessment: History of Falls;Needs assistance Sitting-balance support: Bilateral upper extremity supported;Feet supported (at times she was without feet on floor and needs more help ) Sitting balance-Leahy Scale: Poor   Postural control: Posterior lean Standing balance support: Bilateral upper extremity supported Standing balance-Leahy Scale: Poor Standing balance comment: requries dense cues to keep feet down to initiate but then wbing BLE's                    Cognition Arousal/Alertness: Awake/alert (seems confused) Behavior During Therapy: Anxious (distracted at times) Overall Cognitive Status: No family/caregiver present to determine baseline cognitive functioning       Memory: Decreased recall of precautions;Decreased short-term memory              Exercises      General Comments General comments (skin integrity, edema, etc.): Pt more awake today but is struggling to be confident enough to try to move, may be a cognitive change.      Pertinent Vitals/Pain Pain Assessment: Faces (didn't rate) Pain Score: 6  Faces Pain Scale: Hurts even more Pain Location: R thigh and lower leg Pain Intervention(s): Limited activity within patient's tolerance;Monitored during session;Premedicated before session;Repositioned;Relaxation    Home Living                      Prior Function  PT Goals (current goals can now be found in the care plan section) Acute Rehab PT Goals Patient Stated Goal: none stated Progress towards PT goals: Progressing toward goals (slowly)    Frequency  7X/week    PT Plan Current plan remains appropriate    Co-evaluation             End of Session Equipment Utilized During Treatment: Gait belt Activity Tolerance: Patient tolerated treatment well Patient left: in bed;with call bell/phone within reach     Time: 1215-1239 PT Time Calculation (min) (ACUTE ONLY): 24  min  Charges:  $Therapeutic Activity: 23-37 mins                    G CodesIvar Drape 01/12/2015, 5:21 PM   Samul Dada, PT MS Acute Rehab Dept. Number: [phone]

## 2015-01-03 NOTE — Clinical Social Work Note (Signed)
CSW was notified that patient is from [facility]([place]  PT is recommending SNF upon discharge.  ALF aware and is agreeable to provide SNF level of care to patient once medically discharged.  CSW will continue to follow for disposition assistance.  Vickii PennaGina Kenidi Elenbaas, LCSWA [phone]  Psychiatric & Orthopedics (5N 1-16) Clinical Social Worker

## 2015-01-03 NOTE — Progress Notes (Signed)
Patient ID: [name], female   DOB: Jan 18, 1924, 81 y.o.   MRN: [id number] Postoperative day 1 right total hip arthroplasty. Patient complains of some pain this morning. Plan for discharge to [facility] when safe with therapy.

## 2015-01-03 NOTE — Clinical Documentation Improvement (Signed)
OA of right hip; THR performed.  Please clarify the type of OA the patient has and document findings in next progress note and include in discharge summary.   Primary  Secondary  Erosive  Traumatic  Other Condition  Thank You, Shellee MiloEileen T Alessia Gonsalez ,RN Clinical Documentation Specialist:  [phone]  [facility]- Health Information Management

## 2015-01-03 NOTE — Progress Notes (Signed)
Patient was experiencing urinary retention. A bladder scan revealed > 350 cc. An intermittent/straight cath was performed and yielded 1000 cc. Patient is now experiencing much relief.

## 2015-01-03 NOTE — Progress Notes (Signed)
Patient BP 149/68 at 1304. At this time patient was in pain. When BP was recheck it was 110/49, HR 104. Patient is a little tachy. MD notified. No response. Will continue to monitor patient.

## 2015-01-03 NOTE — Evaluation (Signed)
Clinical/Bedside Swallow Evaluation Patient Details  Name: [name] MRN: [id number] Date of Birth: Feb 06, 1924  Today's Date: 01/03/2015 Time: 9518-8416 SLP Time Calculation (min) (ACUTE ONLY): 21 min  Past Medical History:  Past Medical History  Diagnosis Date  . Arthritis   . Osteoporosis   . Essential hypertension, benign   . Osteoarthrosis, unspecified whether generalized or localized, unspecified site   . Other malaise and fatigue     re: stress, anxiety  . Allergic rhinitis, cause unspecified   . Osteoporosis/osteopenia increased risk   . Macular degeneration   . Venous insufficiency   . Cataract cortical, senile 2010    s/p bil extract/IOL implants   . Degenerative disk disease 06/2013  . Spinal stenosis of lumbar region 07/08/2013  . Hyponatremia 07/19/2013  . Right foot drop 08/08/2013  . Mitral valve prolapse   . GERD (gastroesophageal reflux disease)   . Constipation   . Pneumonia 1920's   Past Surgical History:  Past Surgical History  Procedure Laterality Date  . Total hip arthroplasty Left 2005  . Replacement total knee Right 2010  . Tonsillectomy    . Colonoscopy    . Total hip arthroplasty Right 01/02/2015  . Joint replacement    . Cataract extraction w/ intraocular lens  implant, bilateral Bilateral    HPI:  Pt is an 81 yo female admitted 1/13 for R THA. Pt reports a h/o esophageal dysphagia that is facilitated by taking prilosec prior to intake.   Assessment / Plan / Recommendation Clinical Impression  Pt reports a h/o GERD and esophageal dysphagia, for which she takes prilosec before meals. Throughout intake, pt has a consistent, immediate throat clear following cup sips of thin liquid. With SLP intervention for nectar thick liquids, pt exhibits an occasional, delayed throat clear, suspect related to h/o reflux. Pt also has this intermittent delayed throat clearing with solid consistencies as well. Recommend Dys 3 diet and nectar thick liquids at this  time as pt is recovering from recent hip surgery. Prognosis for return to thin liquids is good with as she is able to be increasingly more mobile. SLP to follow.    Aspiration Risk  Moderate    Diet Recommendation Dysphagia 3 (Mechanical Soft);Nectar-thick liquid   Liquid Administration via: Cup;No straw Medication Administration: Whole meds with puree Supervision: Patient able to self feed;Full supervision/cueing for compensatory strategies Compensations: Slow rate;Small sips/bites;Follow solids with liquid Postural Changes and/or Swallow Maneuvers: Seated upright 90 degrees;Upright 30-60 min after meal    Other  Recommendations Oral Care Recommendations: Oral care BID Other Recommendations: Order thickener from pharmacy;Prohibited food (jello, ice cream, thin soups);Remove water pitcher   Follow Up Recommendations  Skilled Nursing facility    Frequency and Duration min 2x/week  2 weeks   Pertinent Vitals/Pain n/a    SLP Swallow Goals     Swallow Study Prior Functional Status       General Date of Onset: 01/02/15 HPI: Pt is an 81 yo female admitted 1/13 for R THA. Pt reports a h/o esophageal dysphagia that is facilitated by taking prilosec prior to intake. Type of Study: Bedside swallow evaluation Previous Swallow Assessment: none in chart, reports being scoped before by MD  Diet Prior to this Study: Regular;Thin liquids Temperature Spikes Noted: Yes (low grade) Respiratory Status: Nasal cannula History of Recent Intubation: Yes Length of Intubations (days):  (for procedure) Date extubated: 01/02/15 Behavior/Cognition: Alert;Cooperative;Pleasant mood;Confused Oral Cavity - Dentition: Adequate natural dentition Self-Feeding Abilities: Able to feed self Patient Positioning: Partially reclined (  unable to tolerate 90 degrees due to hip pain) Baseline Vocal Quality: Clear    Oral/Motor/Sensory Function Overall Oral Motor/Sensory Function: Appears within functional limits  for tasks assessed   Ice Chips Ice chips: Not tested   Thin Liquid Thin Liquid: Impaired Presentation: Cup;Self Fed Pharyngeal  Phase Impairments: Suspected delayed Swallow;Throat Clearing - Immediate    Nectar Thick Nectar Thick Liquid: Impaired Presentation: Cup;Self Fed Pharyngeal Phase Impairments: Suspected delayed Swallow;Throat Clearing - Delayed   Honey Thick Honey Thick Liquid: Not tested   Puree Puree: Impaired Presentation: Self Fed;Spoon Pharyngeal Phase Impairments: Suspected delayed Swallow;Throat Clearing - Delayed   Solid   GO    Solid: Impaired Presentation: Self Fed Pharyngeal Phase Impairments: Throat Clearing - Delayed       Maxcine Ham, M.A. CCC-SLP [phone]  Maxcine Ham 01/03/2015,10:10 AM

## 2015-01-03 NOTE — Progress Notes (Signed)
Physical Therapy Treatment Patient Details Name: [name] MRN: [id number] DOB: [birth year] Today's Date: 01/03/2015    History of Present Illness Pt is an 81 yo female admitted 1/13 for R THA.    PT Comments    Pt was seen with family in to observe and was helpful to get their encouragement to try to be OOB.  Pt was able to be assisted to stand a short time and to scoot up bed max assist.  Plan in place for her to go to SNF tomorrow likely.  Follow Up Recommendations  SNF;Supervision/Assistance - 24 hour     Equipment Recommendations  None recommended by PT    Recommendations for Other Services       Precautions / Restrictions Precautions Precautions: Posterior Hip;Fall Precaution Booklet Issued: Yes (comment) Precaution Comments: educated pt however poor carry over even with prompting within session Restrictions Weight Bearing Restrictions: Yes RLE Weight Bearing: Weight bearing as tolerated    Mobility  Bed Mobility Overal bed mobility: Needs Assistance;+2 for physical assistance Bed Mobility: Supine to Sit;Sit to Supine     Supine to sit: Max assist Sit to supine: Max assist;+2 for physical assistance   General bed mobility comments: Pt was encouraged to assist the transition but she did minimal work, mainly helping to slide LLE a bit and finally used bed pad to both finish getting up and returning to bed with CNA  Transfers Overall transfer level: Needs assistance Equipment used: Rolling walker (2 wheeled);1 person hand held assist Transfers: Sit to/from Stand Sit to Stand: Max assist         General transfer comment: dense repetition of cues for sequence and hand placement, assistance to keep feet on floor as pt did not follow directions to touch her feet to floor in sitting  Ambulation/Gait             General Gait Details: unable   Stairs            Wheelchair Mobility    Modified Rankin (Stroke Patients Only)       Balance  Overall balance assessment: History of Falls;Needs assistance Sitting-balance support: Bilateral upper extremity supported;Feet supported (at times she was without feet on floor and needs more help ) Sitting balance-Leahy Scale: Poor   Postural control: Posterior lean Standing balance support: Bilateral upper extremity supported Standing balance-Leahy Scale: Poor Standing balance comment: requries dense cues to keep feet down to initiate but then wbing BLE's                    Cognition Arousal/Alertness: Awake/alert (seems confused) Behavior During Therapy: Anxious;Flat affect Overall Cognitive Status: Impaired/Different from baseline Area of Impairment: Memory;Attention;Following commands;Safety/judgement;Awareness;Problem solving   Current Attention Level: Divided Memory: Decreased recall of precautions;Decreased short-term memory Following Commands: Follows one step commands inconsistently Safety/Judgement: Decreased awareness of safety;Decreased awareness of deficits Awareness: Intellectual;Anticipatory Problem Solving: Slow processing;Decreased initiation;Difficulty sequencing;Requires verbal cues;Requires tactile cues      Exercises Total Joint Exercises Ankle Circles/Pumps: AAROM;Both;5 reps Quad Sets: AROM;Both;5 reps Gluteal Sets: AROM;Both;5 reps Heel Slides: AAROM;Right;10 reps Hip ABduction/ADduction: AROM;AAROM;Both;10 reps    General Comments General comments (skin integrity, edema, etc.): Daughters in to see pt which was helpful for them to observe her affect, and did not react to her slowness to follow commands.      Pertinent Vitals/Pain Pain Assessment: Faces (didn't rate) Pain Score: 6  Faces Pain Scale: Hurts little more Pain Location: R lower leg  Pain Intervention(s): Limited activity within  patient's tolerance;Monitored during session;Premedicated before session;Relaxation    Home Living                      Prior Function             PT Goals (current goals can now be found in the care plan section) Acute Rehab PT Goals Patient Stated Goal: none stated Progress towards PT goals: Progressing toward goals    Frequency  7X/week    PT Plan Current plan remains appropriate    Co-evaluation             End of Session Equipment Utilized During Treatment: Other (comment) (FWW) Activity Tolerance: Patient tolerated treatment well Patient left: in bed;with call bell/phone within reach     Time: 4098-1191 PT Time Calculation (min) (ACUTE ONLY): 32 min  Charges:  $Therapeutic Exercise: 8-22 mins $Therapeutic Activity: 8-22 mins                    G Codes:      Ivar Drape 01-15-2015, 5:32 PM   Samul Dada, PT MS Acute Rehab Dept. Number: [phone]

## 2015-01-04 LAB — BASIC METABOLIC PANEL
ANION GAP: 4 — AB (ref 5–15)
BUN: 16 mg/dL (ref 6–23)
CALCIUM: 7.7 mg/dL — AB (ref 8.4–10.5)
CO2: 28 mmol/L (ref 19–32)
CREATININE: 1 mg/dL (ref 0.50–1.10)
Chloride: 93 mEq/L — ABNORMAL LOW (ref 96–112)
GFR calc non Af Amer: 49 mL/min — ABNORMAL LOW (ref >90)
GFR, EST AFRICAN AMERICAN: 57 mL/min — AB (ref >90)
GLUCOSE: 121 mg/dL — AB (ref 70–99)
POTASSIUM: 3.9 mmol/L (ref 3.5–5.1)
Sodium: 125 mmol/L — ABNORMAL LOW (ref 135–145)

## 2015-01-04 LAB — CBC
HCT: 21.1 % — ABNORMAL LOW (ref 36.0–46.0)
Hemoglobin: 7.2 g/dL — ABNORMAL LOW (ref 12.0–15.0)
MCH: 30.9 pg (ref 26.0–34.0)
MCHC: 34.1 g/dL (ref 30.0–36.0)
MCV: 90.6 fL (ref 78.0–100.0)
PLATELETS: 228 10*3/uL (ref 150–400)
RBC: 2.33 MIL/uL — ABNORMAL LOW (ref 3.87–5.11)
RDW: 13.3 % (ref 11.5–15.5)
WBC: 6.1 10*3/uL (ref 4.0–10.5)

## 2015-01-04 MED ORDER — OXYCODONE-ACETAMINOPHEN 5-325 MG PO TABS: 1.0000 | ORAL_TABLET | ORAL | Status: DC | PRN

## 2015-01-04 MED ORDER — ASPIRIN EC 325 MG PO TBEC: 325.0000 mg | DELAYED_RELEASE_TABLET | Freq: Every day | ORAL | Status: DC

## 2015-01-04 NOTE — Clinical Social Work Note (Signed)
Patient to discharge today per MD order. Patient to discharge (return) to [facility] (long term resident) RN to call report prior to transportation to: [phone] Transportation: [facility] (scheduled for 12:30pm per SNF)  Patient and RN have been updated.  CSW spoke with Lupita Leashonna at [facility] who reports that patient will not be using her Medicare benefits when returning to [facility] though patient would receive STR as recommended by MD/PT.  Patient is aware of Medicare guidelines for 3 midnights and has reviewed discharge plans with daughters and facility.  Patient is agreeable to dc to [facility] today (prior to her 3 midnights).  Facility states patient is a long term resident at [facility] and will receive appropriate therapies.  Vickii PennaGina Johanthan Kneeland, LCSWA [phone]  Psychiatric & Orthopedics (5N 1-16) Clinical Social Worker

## 2015-01-04 NOTE — Discharge Summary (Signed)
Physician Discharge Summary  Patient ID: [name] MRN: [id number] DOB/AGE: 1924-12-14 81 y.o.  Admit date: 01/02/2015 Discharge date: 01/04/2015  Admission Diagnoses: Primary osteoarthritis right hip  Discharge Diagnoses: Same Active Problems:   S/P total hip arthroplasty   Discharged Condition: stable  Hospital Course: Patient's hospital course was essentially unremarkable. She was slow to progress with physical therapy and was discharged to skilled nursing at [facility]  Consults: None  Significant Diagnostic Studies: labs: Routine labs   Treatments: surgery: See operative note  Discharge Exam: Blood pressure 105/64, pulse 90, temperature 98.5 F (36.9 C), temperature source Oral, resp. rate 19, height 5\' 3"  (1.6 m), weight 63.957 kg (141 lb), SpO2 96 %. Incision/Wound: dressing clean dry and intact  Disposition: 03-Skilled Nursing Facility  Discharge Instructions    Call MD / Call 911    Complete by:  As directed   If you experience chest pain or shortness of breath, CALL 911 and be transported to the hospital emergency room.  If you develope a fever above 101 F, pus (white drainage) or increased drainage or redness at the wound, or calf pain, call your surgeon's office.     Constipation Prevention    Complete by:  As directed   Drink plenty of fluids.  Prune juice may be helpful.  You may use a stool softener, such as Colace (over the counter) 100 mg twice a day.  Use MiraLax (over the counter) for constipation as needed.     Diet - low sodium heart healthy    Complete by:  As directed      Increase activity slowly as tolerated    Complete by:  As directed      Weight bearing as tolerated    Complete by:  As directed             Medication List    TAKE these medications        amLODipine 10 MG tablet  Commonly known as:  NORVASC  Take 1 tablet (10 mg total) by mouth daily.     amLODipine 5 MG tablet  Commonly known as:  NORVASC  Take 5 mg by mouth  daily.     aspirin EC 325 MG tablet  Take 1 tablet (325 mg total) by mouth daily.     aspirin EC 81 MG tablet  Take 81 mg by mouth daily.     beta carotene w/minerals tablet  Take 1 tablet by mouth daily.     CALCIUM 600 + D PO  Take 1 tablet by mouth daily.     diphenhydrAMINE 25 MG tablet  Commonly known as:  BENADRYL  Take 50 mg by mouth at bedtime.     DULoxetine 60 MG capsule  Commonly known as:  CYMBALTA  Take 60 mg by mouth daily.     ergocalciferol 50000 UNITS capsule  Commonly known as:  VITAMIN D2  Take 50,000 Units by mouth once a week. On saturday     gabapentin 300 MG capsule  Commonly known as:  NEURONTIN  Take 300 mg by mouth 2 (two) times daily.     hydrochlorothiazide 12.5 MG capsule  Commonly known as:  MICROZIDE  Take 12.5 mg by mouth daily.     HYDROcodone-acetaminophen 5-325 MG per tablet  Commonly known as:  NORCO/VICODIN  Take 1 tablet by mouth every 4 (four) hours as needed for pain. For pain     olmesartan 20 MG tablet  Commonly known as:  BENICAR  Take  20 mg by mouth daily.     oxyCODONE-acetaminophen 5-325 MG per tablet  Commonly known as:  ROXICET  Take 1 tablet by mouth every 4 (four) hours as needed for severe pain.     senna 8.6 MG Tabs tablet  Commonly known as:  SENOKOT  Take 1 tablet by mouth at bedtime as needed (for constipation).     simethicone 80 MG chewable tablet  Commonly known as:  MYLICON  Chew 80 mg by mouth every 6 (six) hours as needed for flatulence.     VITAMIN D PO  Take 1 tablet by mouth daily.           Follow-up Information    Follow up with Evy Lutterman V, MD In 2 weeks.   Specialty:  Orthopedic Surgery   Contact information:   [address] [place] [address] [phone]       Signed: Nadara Mustard 01/04/2015, 6:12 AM

## 2015-01-04 NOTE — Clinical Social Work Psych Assess (Signed)
Clinical Social Work Department BRIEF PSYCHOSOCIAL ASSESSMENT 01/04/2015  Patient:  [name],[name]     Account Number:  [id number]     Admit date:  01/02/2015  Clinical Social Worker:  Read DriversINGLE,Kamill Fulbright, LCSWA  Date/Time:  01/03/2015 12:21 PM  Referred by:  Physician  Date Referred:  01/03/2015 Referred for  SNF Placement  Psychosocial assessment   Other Referral:   none   Interview type:  Patient Other interview type:   patient is alert and oriented    PSYCHOSOCIAL DATA Living Status:  FACILITY [facility] Level of care:  Assisted Living Primary support name:  Vladimir FasterJill and Stacey Primary support relationship to patient:  CHILD, ADULT Degree of support available:   strong    CURRENT CONCERNS Current Concerns  Post-Acute Placement   Other Concerns:   none    SOCIAL WORK ASSESSMENT / PLAN CSW assessed patient at bedside.  Patient is alert and oriented x4. Patient reports being from [facility] ALF and requests to return once medically discharged.  CSW contacted admission's liaison at [facility] who reports patient ability to return.  Patient is very pleasant and presents as anxious to return home and begin STR.  Facility states that patient will return without using Medicare benefits for STR and will receive PT and OT.  Patient is a long term resident.  Patient is agreeable to [facility]'s plan of discharge.  Patient hopeful to return to independent level of ADL functioning after completion of STR.   Assessment/plan status:  Psychosocial Support/Ongoing Assessment of Needs Other assessment/ plan:   FL2  PASARR   Information/referral to community resources:   SNF/STR    PATIENT'S/FAMILY'S RESPONSE TO PLAN OF CARE: Patient is agreeable to returning to [facility] once medically discharged.       Vickii PennaGina Gerren Hoffmeier, LCSWA [phone]  Psychiatric & Orthopedics (5N 1-16) Clinical Social Worker

## 2015-01-04 NOTE — Progress Notes (Signed)
Physical Therapy Treatment Patient Details Name: [name] MRN: [id number] DOB: 12-13-1924 Today's Date: 01/04/2015    History of Present Illness Pt is an 81 yo female admitted 1/13 for R THA.    PT Comments    Pt was seen for evaluation of her current mobility and due to poor sleep declined to get OOB.  However, she was able to make progress with moving RLE in more comfort and was much better able to use the LE.  This bodes well for being able to stand in a better controlled position and try steps at SNF.  Will discharge from hosp today likely.  Follow Up Recommendations  SNF;Supervision/Assistance - 24 hour     Equipment Recommendations  None recommended by PT    Recommendations for Other Services       Precautions / Restrictions Precautions Precautions: Posterior Hip;Fall Precaution Booklet Issued: Yes (comment) Precaution Comments: cannot remember the precautions Restrictions Weight Bearing Restrictions: Yes RLE Weight Bearing: Weight bearing as tolerated    Mobility  Bed Mobility Overal bed mobility: Needs Assistance                Transfers                 General transfer comment: declined  Ambulation/Gait             General Gait Details: unable   Stairs            Wheelchair Mobility    Modified Rankin (Stroke Patients Only)       Balance                                    Cognition Arousal/Alertness: Awake/alert Behavior During Therapy: WFL for tasks assessed/performed Overall Cognitive Status: Within Functional Limits for tasks assessed               Problem Solving: Slow processing;Requires verbal cues;Requires tactile cues General Comments: following PT instructions much faster today and with better accuracy.  Pt is looking forward to SNF stay to get back moving again    Exercises Total Joint Exercises Ankle Circles/Pumps: AAROM;Both;10 reps Quad Sets: AROM;Both;10 reps Gluteal Sets:  AROM;Both;10 reps Towel Squeeze: AROM;Both;10 reps Heel Slides: AAROM;Both;AROM;10 reps Hip ABduction/ADduction: AROM;AAROM;Both;10 reps Straight Leg Raises: AAROM;Right;10 reps    General Comments General comments (skin integrity, edema, etc.): Pt reported poor sleep and was given bed ex's to keep up her goals for therapy.  She is having some trouble sleeping because of alarms and machines in the hosp but hopefully will be more relaxed in SNF      Pertinent Vitals/Pain Pain Assessment: Faces Pain Score: 3  Faces Pain Scale: Hurts a little bit Pain Location: skin on R lower leg Pain Intervention(s): Limited activity within patient's tolerance;Monitored during session;Premedicated before session;Repositioned    Home Living                      Prior Function            PT Goals (current goals can now be found in the care plan section) Acute Rehab PT Goals Patient Stated Goal: none Progress towards PT goals: Progressing toward goals (slowly but able to move R hip in more comfort today)    Frequency  7X/week    PT Plan Current plan remains appropriate    Co-evaluation  End of Session   Activity Tolerance: Patient tolerated treatment well Patient left: in bed;with call bell/phone within reach     Time: 1610-9604 PT Time Calculation (min) (ACUTE ONLY): 22 min  Charges:  $Therapeutic Exercise: 8-22 mins                    G Codes:      Ivar Drape 01/06/15, 12:00 PM   Samul Dada, PT MS Acute Rehab Dept. Number: [phone]

## 2015-01-04 NOTE — Progress Notes (Signed)
Pt unable to void after 2 in and out caths previously on 1/13 and 1/14. Bladder scan @ 2200 on 1/14 showed 310 ccs. Dr. August Saucerean notified, and foley was placed. 350 ccs output initially.   [name], Rahman Ferrall G  01/04/2015

## 2015-01-04 NOTE — Progress Notes (Signed)
[name] discharged SNF [facility]) per MD order. Discharge instructions reviewed and discussed with patient. All questions and concerns answered. Copy of instructions and scripts given to patient. IV removed.  Patient escorted to car by staff in a wheelchair. No distress noted upon discharge.   Dennard NipScott, Deloria Brassfield R 01/04/2015 11:57 AM

## 2015-01-08 ENCOUNTER — Encounter (HOSPITAL_COMMUNITY): Payer: Self-pay | Admitting: Orthopedic Surgery

## 2015-01-08 ENCOUNTER — Non-Acute Institutional Stay (SKILLED_NURSING_FACILITY): Payer: Medicare Other | Admitting: Internal Medicine

## 2015-01-08 DIAGNOSIS — I10 Essential (primary) hypertension: Secondary | ICD-10-CM

## 2015-01-08 DIAGNOSIS — Z96649 Presence of unspecified artificial hip joint: Secondary | ICD-10-CM

## 2015-01-08 DIAGNOSIS — M48061 Spinal stenosis, lumbar region without neurogenic claudication: Secondary | ICD-10-CM

## 2015-01-08 DIAGNOSIS — M4806 Spinal stenosis, lumbar region: Secondary | ICD-10-CM

## 2015-01-08 DIAGNOSIS — M1611 Unilateral primary osteoarthritis, right hip: Secondary | ICD-10-CM | POA: Insufficient documentation

## 2015-01-08 DIAGNOSIS — Z9660 Presence of unspecified orthopedic joint implant: Secondary | ICD-10-CM

## 2015-01-08 DIAGNOSIS — K5903 Drug induced constipation: Secondary | ICD-10-CM

## 2015-01-08 DIAGNOSIS — T50905A Adverse effect of unspecified drugs, medicaments and biological substances, initial encounter: Secondary | ICD-10-CM

## 2015-01-08 DIAGNOSIS — M21371 Foot drop, right foot: Secondary | ICD-10-CM

## 2015-01-08 DIAGNOSIS — K5909 Other constipation: Secondary | ICD-10-CM

## 2015-01-08 DIAGNOSIS — E871 Hypo-osmolality and hyponatremia: Secondary | ICD-10-CM

## 2015-01-08 NOTE — Progress Notes (Signed)
Patient ID: [name], female   DOB: 1924-05-28, 81 y.o.   MRN: [id number]   Location:  [facility]  Code Status: DNR  No Known Allergies  Chief Complaint  Patient presents with  . New Admit To SNF    s/p hospitalization for right total hip replacement by Dr. Aldean Baker due to primary OA of right hip    HPI: Patient is an 81 y.o. female with h/o primary OA of her right hip, lumbar spinal stenosis, chronic constipation, hyponatremia, right foot drop, hypertension, and sacral fracture seen in the office today for new admission to rehab s/p hospitalization for right total hip replacement by Dr. Lajoyce Corners.  She was slow to recuperate at the hospital per records, but her stay was relatively uncomplicated.  Upon arrival here, we ordered hydrocodone for her, but she indicated she did not tolerate this well so oxycodone/apap was ordered.  She says this worked for her hip pain and made it easier to stand up with therapy, but made her "loopy".  She was then changed to tylenol which was inadequate for pain so the on call provider was called who put her on tramadol 50mg --she has done well with two tablets this am before therapy.  She uses gabapentin also for neuropathic pain from her spinal stenosis.  Also, she had not had a bm, and was feeling quite constipated, but she had a successful bm this morning, as well.  She's had no difficulty passing her urine.    She also has several skin tears from a fall that occurred prior to her surgery--one on each lower leg and one on her left arm.  She has extremely thin skin.  Review of Systems:  Review of Systems  Constitutional: Negative for fever, chills and malaise/fatigue.  HENT: Negative for congestion and hearing loss.   Eyes: Negative for blurred vision.  Respiratory: Negative for shortness of breath.   Cardiovascular: Negative for chest pain and leg swelling.  Gastrointestinal: Positive for constipation. Negative for abdominal pain, diarrhea, blood in  stool and melena.  Genitourinary: Negative for dysuria, urgency and frequency.  Musculoskeletal: Positive for joint pain and falls. Negative for myalgias.       Had fall when hip gave out before replacement, none since  Skin:       Extremely thin skin  Neurological: Negative for dizziness and weakness.  Endo/Heme/Allergies: Does not bruise/bleed easily.  Psychiatric/Behavioral: Negative for depression and memory loss.     Past Medical History  Diagnosis Date  . Arthritis   . Osteoporosis   . Essential hypertension, benign   . Osteoarthrosis, unspecified whether generalized or localized, unspecified site   . Other malaise and fatigue     re: stress, anxiety  . Allergic rhinitis, cause unspecified   . Osteoporosis/osteopenia increased risk   . Macular degeneration   . Venous insufficiency   . Cataract cortical, senile 2010    s/p bil extract/IOL implants   . Degenerative disk disease 06/2013  . Spinal stenosis of lumbar region 07/08/2013  . Hyponatremia 07/19/2013  . Right foot drop 08/08/2013  . Mitral valve prolapse   . GERD (gastroesophageal reflux disease)   . Constipation   . Pneumonia 1920's    Past Surgical History  Procedure Laterality Date  . Total hip arthroplasty Left 2005  . Replacement total knee Right 2010  . Tonsillectomy    . Colonoscopy    . Total hip arthroplasty Right 01/02/2015  . Joint replacement    . Cataract extraction  w/ intraocular lens  implant, bilateral Bilateral   . Total hip arthroplasty Right 01/02/2015    Procedure: TOTAL HIP ARTHROPLASTY;  Surgeon: Nadara Mustard, MD;  Location: [facility] OR;  Service: Orthopedics;  Laterality: Right;    Social History:   reports that she quit smoking about 51 years ago. Her smoking use included Cigarettes. She has a 10 pack-year smoking history. She has never used smokeless tobacco. She reports that she drinks about 8.4 oz of alcohol per week. She reports that she does not use illicit drugs.  Family History    Problem Relation Age of Onset  . Esophageal cancer Mother 83  . Breast cancer Daughter   . Thyroid disease Daughter     Medications: Patient's Medications  New Prescriptions   No medications on file  Previous Medications   AMLODIPINE (NORVASC) 10 MG TABLET    Take 1 tablet (10 mg total) by mouth daily.   AMLODIPINE (NORVASC) 5 MG TABLET    Take 5 mg by mouth daily.   ASPIRIN EC 325 MG TABLET    Take 1 tablet (325 mg total) by mouth daily.   ASPIRIN EC 81 MG TABLET    Take 81 mg by mouth daily.   BETA CAROTENE W/MINERALS (OCUVITE) TABLET    Take 1 tablet by mouth daily.   CALCIUM CARBONATE-VITAMIN D (CALCIUM 600 + D PO)    Take 1 tablet by mouth daily.   CHOLECALCIFEROL (VITAMIN D PO)    Take 1 tablet by mouth daily.   DIPHENHYDRAMINE (BENADRYL) 25 MG TABLET    Take 50 mg by mouth at bedtime.   DULOXETINE (CYMBALTA) 60 MG CAPSULE    Take 60 mg by mouth daily.   ERGOCALCIFEROL (VITAMIN D2) 50000 UNITS CAPSULE    Take 50,000 Units by mouth once a week. On saturday   GABAPENTIN (NEURONTIN) 300 MG CAPSULE    Take 300 mg by mouth 2 (two) times daily.    HYDROCHLOROTHIAZIDE (MICROZIDE) 12.5 MG CAPSULE    Take 12.5 mg by mouth daily.   HYDROCODONE-ACETAMINOPHEN (NORCO/VICODIN) 5-325 MG PER TABLET    Take 1 tablet by mouth every 4 (four) hours as needed for pain. For pain   OLMESARTAN (BENICAR) 20 MG TABLET    Take 20 mg by mouth daily.   OXYCODONE-ACETAMINOPHEN (ROXICET) 5-325 MG PER TABLET    Take 1 tablet by mouth every 4 (four) hours as needed for severe pain.   SENNA (SENOKOT) 8.6 MG TABS TABLET    Take 1 tablet by mouth at bedtime as needed (for constipation).   SIMETHICONE (MYLICON) 80 MG CHEWABLE TABLET    Chew 80 mg by mouth every 6 (six) hours as needed for flatulence.  Modified Medications   No medications on file  Discontinued Medications   No medications on file     Physical Exam: Filed Vitals:   01/08/15 1457  BP: 100/61  Pulse: 79  Temp: 97.8 F (36.6 C)  Resp: 15   Height: 5\' 3"  (1.6 m)  Weight: 131 lb 12.8 oz (59.784 kg)  SpO2: 93%   Physical Exam  Constitutional: She is oriented to person, place, and time. She appears well-developed and well-nourished. No distress.  HENT:  Head: Normocephalic and atraumatic.  Right Ear: External ear normal.  Left Ear: External ear normal.  Nose: Nose normal.  Mouth/Throat: Oropharynx is clear and moist.  Eyes: Conjunctivae and EOM are normal. Pupils are equal, round, and reactive to light.  Wearing glasses  Neck: Normal range of  motion. Neck supple. No JVD present.  Cardiovascular: Normal rate, regular rhythm, normal heart sounds and intact distal pulses.   Pulmonary/Chest: Effort normal and breath sounds normal. No respiratory distress.  Abdominal: Soft. Bowel sounds are normal. She exhibits no distension and no mass. There is no tenderness.  Musculoskeletal: Normal range of motion.  Neurological: She is alert and oriented to person, place, and time.  Skin:  Bilateral lower legs with chronic venous stasis changes, has thin crepe-like skin;  Three skin tears:  Right lateral shin, left anterior shin, and left upper arm;  Right hip wound with all staples intact, no erythema, drainage, some ecchymoses present over buttocks and hip region  Psychiatric: She has a normal mood and affect.  Very pleasant lady    Labs reviewed: Basic Metabolic Panel:  Recent Labs  14/78/29 0413 01/04/15 0520  NA 126* 125*  K 4.1 3.9  CL 87* 93*  CO2 26 28  GLUCOSE 111* 121*  BUN 22 16  CREATININE 1.28* 1.00  CALCIUM 8.0* 7.7*  CBC:  Recent Labs  01/03/15 0413 01/04/15 0520  WBC 5.4 6.1  HGB 7.8* 7.2*  HCT 22.4* 21.1*  MCV 92.9 90.6  PLT 204 228   Past Procedures: No new imaging during hospitalization  Assessment/Plan 1. Primary osteoarthritis of right hip -was having difficulty with the right leg giving out on her and she was falling -conservative measures were unsuccessful so she underwent right total hip  arthroplasty by Dr. Lajoyce Corners -pain is now controlled with tramadol and without "loopiness"  2. S/P total hip arthroplasty -wound looks good--no drainage, erythema and staples are intact, some bruising in the area -dressing had to be replaced due to urine making it wet and loose  3. Spinal stenosis of lumbar region -does not complain of pain related to this at this point and her home gabapentin was continued  4. Hyponatremia -appears chronic (was in mid 120s in 2014 as well); however, she is on a thiazide diuretic so I will stop this and see if her Na improves on f/u bmp next draw  5. Right foot drop -related to #3, to be considered during her therapy--is getting up more with therapy now and using walker with assist of two for support (gait belt)  6. Constipation due to pain medication -cont senna for stool softener and standing orders as needed for constipation  7. Essential hypertension, benign -bp has been running low with amlodipine 15mg  total and hctz 12.5mg  daily -due to her fall risk, hyponatremia and higher than recommended amlodipine dose, amlodipine 5mg  was discontinued (she will take 10mg  only) and I also discontinued hctz -if bp is running over 150/90 (goal in her age group), will need to add other agents  Labs/tests ordered: f/u cbc (postop anemia) and bmp (hyponatremia)   Maraki Macquarrie L. Cailyn Houdek, D.O. Geriatrics [facility] [address], [place] [address] Cell Phone (Mon-Fri 8am-5pm):  [phone] On Call:  [phone] & follow prompts after 5pm & weekends Office Phone:  [phone] Office Fax:  [phone]

## 2015-01-10 ENCOUNTER — Non-Acute Institutional Stay (SKILLED_NURSING_FACILITY): Payer: Medicare Other | Admitting: Adult Health

## 2015-01-10 DIAGNOSIS — K5909 Other constipation: Secondary | ICD-10-CM

## 2015-01-10 DIAGNOSIS — D62 Acute posthemorrhagic anemia: Secondary | ICD-10-CM

## 2015-01-10 DIAGNOSIS — T50905A Adverse effect of unspecified drugs, medicaments and biological substances, initial encounter: Secondary | ICD-10-CM

## 2015-01-10 DIAGNOSIS — K5903 Drug induced constipation: Secondary | ICD-10-CM

## 2015-01-10 LAB — BASIC METABOLIC PANEL
BUN: 25 mg/dL — AB (ref 4–21)
Creatinine: 1.1 mg/dL (ref 0.5–1.1)
GLUCOSE: 90 mg/dL
POTASSIUM: 4.5 mmol/L (ref 3.4–5.3)
SODIUM: 130 mmol/L — AB (ref 137–147)

## 2015-01-10 LAB — CBC AND DIFFERENTIAL
HCT: 23 % — AB (ref 36–46)
Hemoglobin: 7.5 g/dL — AB (ref 12.0–16.0)
Platelets: 435 10*3/uL — AB (ref 150–399)
WBC: 5.1 10*3/mL

## 2015-01-14 ENCOUNTER — Encounter: Payer: Self-pay | Admitting: Adult Health

## 2015-01-14 DIAGNOSIS — D62 Acute posthemorrhagic anemia: Secondary | ICD-10-CM | POA: Insufficient documentation

## 2015-01-14 NOTE — Progress Notes (Signed)
Patient ID: [name], female   DOB: 11/06/1924, 81 y.o.   MRN: [id number]  Nursing Home Location:  [facility]    Place of Service: SNF [phone])  Chief Complaint  Patient presents with  . Acute Visit    anemia    HPI:  Patient is an 81 y.o. female with h/o primary OA of her right hip, lumbar spinal stenosis, chronic constipation, hyponatremia, right foot drop, hypertension, and sacral fracture seen in the skilled rehab section today for anemia and constipation, s/p hospitalization for right total hip replacement by Dr. Lajoyce Corners. She apparently had blood loss anemia in the hospital and her Hgb on d/c was 7.8.  She was not placed on anticoagulation presumably due to this, but is currently on a baby aspirin. She is not currently on iron therapy but is having issues with constipation (no BM for two days with a feeling that she needs to defecate).  Review of Systems:  Review of Systems  Constitutional: Positive for activity change and fatigue. Negative for fever, chills and appetite change.  Respiratory: Negative for cough, shortness of breath and wheezing.   Cardiovascular: Negative for chest pain, palpitations and leg swelling.  Gastrointestinal: Positive for constipation. Negative for abdominal pain, blood in stool and abdominal distention.  Genitourinary: Negative for dysuria and difficulty urinating.  Musculoskeletal: Positive for arthralgias and gait problem.  Neurological: Negative for dizziness, seizures, syncope and speech difficulty.  Psychiatric/Behavioral: Negative for confusion and agitation.    Medications: Patient's Medications  New Prescriptions   No medications on file  Previous Medications   ACETAMINOPHEN (TYLENOL) 650 MG CR TABLET    Take 650 mg by mouth every 6 (six) hours as needed for pain.   AMLODIPINE (NORVASC) 10 MG TABLET    Take 1 tablet (10 mg total) by mouth daily.   ASPIRIN EC 325 MG TABLET    Take 1 tablet (325 mg total) by mouth daily.     ASPIRIN EC 81 MG TABLET    Take 81 mg by mouth daily.   BETA CAROTENE W/MINERALS (OCUVITE) TABLET    Take 1 tablet by mouth daily.   CALCIUM CARBONATE-VITAMIN D (CALCIUM 600 + D PO)    Take 1 tablet by mouth daily.   CHOLECALCIFEROL (VITAMIN D PO)    Take 1 tablet by mouth daily.   DIPHENHYDRAMINE (BENADRYL) 25 MG TABLET    Take 50 mg by mouth at bedtime.   DULOXETINE (CYMBALTA) 60 MG CAPSULE    Take 60 mg by mouth daily.   ERGOCALCIFEROL (VITAMIN D2) 50000 UNITS CAPSULE    Take 50,000 Units by mouth once a week. On saturday   GABAPENTIN (NEURONTIN) 300 MG CAPSULE    Take 300 mg by mouth 2 (two) times daily.    OLMESARTAN (BENICAR) 20 MG TABLET    Take 20 mg by mouth daily.   SENNA (SENOKOT) 8.6 MG TABS TABLET    Take 1 tablet by mouth at bedtime. May increase to 2 tabs qhs if no result   SIMETHICONE (MYLICON) 80 MG CHEWABLE TABLET    Chew 80 mg by mouth every 6 (six) hours as needed for flatulence.   TRAMADOL (ULTRAM) 50 MG TABLET    One tablet every 6 hours as needed for pain 1-5 and two tablets every 6 hours as needed for pain 6-10  Modified Medications   No medications on file  Discontinued Medications   No medications on file     Physical Exam:  Filed Vitals:  01/14/15 0742  BP: 113/56  Pulse: 69  Temp: 98.6 F (37 C)  Resp: 20  SpO2: 95%    Physical Exam  Constitutional: She is oriented to person, place, and time. No distress.  HENT:  Head: Normocephalic and atraumatic.  Neck: No JVD present. No tracheal deviation present.  Cardiovascular: Normal rate, regular rhythm and normal heart sounds.   No murmur heard. Pulmonary/Chest: Effort normal and breath sounds normal. No respiratory distress. She has no wheezes.  Abdominal: Soft. Bowel sounds are normal. She exhibits no distension. There is no tenderness.  Neurological: She is alert and oriented to person, place, and time. No cranial nerve deficit.  Skin: Skin is warm and dry. No rash noted. She is not diaphoretic. No  erythema. There is pallor.  Psychiatric: Affect normal.    Labs reviewed/Significant Diagnostic Results:  Basic Metabolic Panel:  Recent Labs  65/78/46 0413 01/04/15 0520 01/10/15  NA 126* 125* 130*  K 4.1 3.9 4.5  CL 87* 93*  --   CO2 26 28  --   GLUCOSE 111* 121*  --   BUN 22 16 25*  CREATININE 1.28* 1.00 1.1  CALCIUM 8.0* 7.7*  --    Liver Function Tests: No results for input(s): AST, ALT, ALKPHOS, BILITOT, PROT, ALBUMIN in the last 8760 hours. No results for input(s): LIPASE, AMYLASE in the last 8760 hours. No results for input(s): AMMONIA in the last 8760 hours. CBC:  Recent Labs  01/03/15 0413 01/04/15 0520 01/10/15  WBC 5.4 6.1 5.1  HGB 7.8* 7.2* 7.5*  HCT 22.4* 21.1* 23*  MCV 92.9 90.6  --   PLT 204 228 435*     Assessment/Plan Acute post-hemorrhagic anemia Slight drop in Hgb to 7.5. Will not transfuse til <7.0 or active bleed. She is weak and has no energy in therapy. I would like to start Iron but she is constipated. Will address this issue and then reconsider iron therapy next week. CBC recheck in 1 week.    Constipation due to pain medication Senna s 1 tab scheduled at bedtime, may increase to two tabs if no result. Dulcolax supp in the morning.   Peggye Ley, ANP [facility] [phone]

## 2015-01-14 NOTE — Assessment & Plan Note (Signed)
Slight drop in Hgb to 7.5. Will not transfuse til <7.0 or active bleed. She is weak and has no energy in therapy. I would like to start Iron but she is constipated. Will address this issue and then reconsider iron therapy next week. CBC recheck in 1 week.

## 2015-01-14 NOTE — Assessment & Plan Note (Signed)
Senna s 1 tab scheduled at bedtime, may increase to two tabs if no result. Dulcolax supp in the morning.

## 2015-01-17 LAB — CBC AND DIFFERENTIAL
HCT: 26 % — AB (ref 36–46)
HEMOGLOBIN: 8.4 g/dL — AB (ref 12.0–16.0)
Platelets: 357 10*3/uL (ref 150–399)
WBC: 5.7 10*3/mL

## 2015-01-24 ENCOUNTER — Non-Acute Institutional Stay (SKILLED_NURSING_FACILITY): Payer: Medicare Other | Admitting: Adult Health

## 2015-01-24 DIAGNOSIS — T50905A Adverse effect of unspecified drugs, medicaments and biological substances, initial encounter: Secondary | ICD-10-CM

## 2015-01-24 DIAGNOSIS — K5909 Other constipation: Secondary | ICD-10-CM

## 2015-01-24 DIAGNOSIS — M4806 Spinal stenosis, lumbar region: Secondary | ICD-10-CM

## 2015-01-24 DIAGNOSIS — E871 Hypo-osmolality and hyponatremia: Secondary | ICD-10-CM

## 2015-01-24 DIAGNOSIS — M21371 Foot drop, right foot: Secondary | ICD-10-CM

## 2015-01-24 DIAGNOSIS — Z96649 Presence of unspecified artificial hip joint: Secondary | ICD-10-CM

## 2015-01-24 DIAGNOSIS — Z9660 Presence of unspecified orthopedic joint implant: Secondary | ICD-10-CM

## 2015-01-24 DIAGNOSIS — M48061 Spinal stenosis, lumbar region without neurogenic claudication: Secondary | ICD-10-CM

## 2015-01-24 DIAGNOSIS — D62 Acute posthemorrhagic anemia: Secondary | ICD-10-CM

## 2015-01-24 DIAGNOSIS — I10 Essential (primary) hypertension: Secondary | ICD-10-CM

## 2015-01-24 DIAGNOSIS — K5903 Drug induced constipation: Secondary | ICD-10-CM

## 2015-01-24 DIAGNOSIS — M1611 Unilateral primary osteoarthritis, right hip: Secondary | ICD-10-CM

## 2015-01-24 NOTE — Progress Notes (Signed)
Patient ID: [name], female   DOB: 01-14-1924, 80 y.o.   MRN: [id number]   Location:  [facility]  Code Status: DNR  No Known Allergies  Chief Complaint  Patient presents with  . Discharge Note    HPI: Patient is an 81 y.o. female with h/o primary OA of her right hip, lumbar spinal stenosis, chronic constipation, hyponatremia, right foot drop, hypertension, and sacral fracture seen to evaluate for discharge to home, s/p hospitalization for right total hip replacement by Dr. Sharol Given. She has worked with PT and met her goals. She is ambulating with a walker. Her BM's are regular now on Senna s. Her wound and skin tears are healing. She had blood loss anemia during her stay and was placed on ferrous sulfate. Hgb at discharge 8.4. Her Na was low during her stay and HCTZ was discontinued. Her Na on D/C was 130. She has no complaints today and voices that she is ready to return home.    Review of Systems:  Review of Systems  Constitutional: Negative for fever, chills, weight loss, malaise/fatigue and diaphoresis.  Respiratory: Negative for cough, sputum production and shortness of breath.   Cardiovascular: Negative for chest pain, orthopnea, leg swelling and PND.  Gastrointestinal: Negative for heartburn, nausea and constipation.  Genitourinary: Negative for dysuria.  Musculoskeletal: Positive for back pain and joint pain. Negative for myalgias and neck pain.  Skin: Negative for itching and rash.  Neurological: Negative for dizziness, seizures, loss of consciousness and weakness.  Psychiatric/Behavioral: Negative for depression and memory loss. The patient is not nervous/anxious.      Past Medical History  Diagnosis Date  . Arthritis   . Osteoporosis   . Essential hypertension, benign   . Osteoarthrosis, unspecified whether generalized or localized, unspecified site   . Other malaise and fatigue     re: stress, anxiety  . Allergic rhinitis, cause unspecified   .  Osteoporosis/osteopenia increased risk   . Macular degeneration   . Venous insufficiency   . Cataract cortical, senile 2010    s/p bil extract/IOL implants   . Degenerative disk disease 06/2013  . Spinal stenosis of lumbar region 07/08/2013  . Hyponatremia 07/19/2013  . Right foot drop 08/08/2013  . Mitral valve prolapse   . GERD (gastroesophageal reflux disease)   . Constipation   . Pneumonia 1920's    Past Surgical History  Procedure Laterality Date  . Total hip arthroplasty Left 2005  . Replacement total knee Right 2010  . Tonsillectomy    . Colonoscopy    . Total hip arthroplasty Right 01/02/2015  . Joint replacement    . Cataract extraction w/ intraocular lens  implant, bilateral Bilateral   . Total hip arthroplasty Right 01/02/2015    Procedure: TOTAL HIP ARTHROPLASTY;  Surgeon: Newt Minion, MD;  Location: [facility];  Service: Orthopedics;  Laterality: Right;    Social History:   reports that she quit smoking about 51 years ago. Her smoking use included Cigarettes. She has a 10 pack-year smoking history. She has never used smokeless tobacco. She reports that she drinks about 8.4 oz of alcohol per week. She reports that she does not use illicit drugs.  Family History  Problem Relation Age of Onset  . Esophageal cancer Mother 21  . Breast cancer Daughter   . Thyroid disease Daughter     Medications: Patient's Medications  New Prescriptions   No medications on file  Previous Medications   ACETAMINOPHEN (TYLENOL) 650 MG CR TABLET  Take 650 mg by mouth every 6 (six) hours as needed for pain.   AMLODIPINE (NORVASC) 10 MG TABLET    Take 1 tablet (10 mg total) by mouth daily.   ASPIRIN EC 325 MG TABLET    Take 1 tablet (325 mg total) by mouth daily.   ASPIRIN EC 81 MG TABLET    Take 81 mg by mouth daily.   BETA CAROTENE W/MINERALS (OCUVITE) TABLET    Take 1 tablet by mouth daily.   CALCIUM CARBONATE-VITAMIN D (CALCIUM 600 + D PO)    Take 1 tablet by mouth daily.    CHOLECALCIFEROL (VITAMIN D PO)    Take 1 tablet by mouth daily.   DIPHENHYDRAMINE (BENADRYL) 25 MG TABLET    Take 50 mg by mouth at bedtime.   DULOXETINE (CYMBALTA) 60 MG CAPSULE    Take 60 mg by mouth daily.   ERGOCALCIFEROL (VITAMIN D2) 50000 UNITS CAPSULE    Take 50,000 Units by mouth once a week. On saturday   GABAPENTIN (NEURONTIN) 300 MG CAPSULE    Take 300 mg by mouth 2 (two) times daily.    OLMESARTAN (BENICAR) 20 MG TABLET    Take 20 mg by mouth daily.   SENNA (SENOKOT) 8.6 MG TABS TABLET    Take 1 tablet by mouth at bedtime. May increase to 2 tabs qhs if no result   SIMETHICONE (MYLICON) 80 MG CHEWABLE TABLET    Chew 80 mg by mouth every 6 (six) hours as needed for flatulence.   TRAMADOL (ULTRAM) 50 MG TABLET    One tablet every 6 hours as needed for pain 1-5 and two tablets every 6 hours as needed for pain 6-10  Modified Medications   No medications on file  Discontinued Medications   No medications on file     Physical Exam: Filed Vitals:   01/24/15 1645  BP: 142/67  Pulse: 73  Temp: 97.2 F (36.2 C)  Resp: 18  Weight: 138 lb 12.8 oz (62.959 kg)  SpO2: 97%   Physical Exam  Constitutional: She is oriented to person, place, and time. She appears well-developed and well-nourished. No distress.  HENT:  Head: Normocephalic and atraumatic.  Right Ear: External ear normal.  Left Ear: External ear normal.  Nose: Nose normal.  Mouth/Throat: Oropharynx is clear and moist.  Eyes: Conjunctivae and EOM are normal. Pupils are equal, round, and reactive to light.  Wearing glasses  Neck: Normal range of motion. Neck supple. No JVD present.  Cardiovascular: Normal rate, regular rhythm, normal heart sounds and intact distal pulses.   Pulmonary/Chest: Effort normal and breath sounds normal. No respiratory distress.  Abdominal: Soft. Bowel sounds are normal. She exhibits no distension and no mass. There is no tenderness.  Musculoskeletal: Normal range of motion.  Neurological: She  is alert and oriented to person, place, and time.  Skin:  Bilateral lower legs with chronic venous stasis changes, has thin crepe-like skin;  Three skin tears:  Right lateral shin, left anterior shin, and left upper arm;  Right hip wound intact, no drainage, minimal bruising  Psychiatric: She has a normal mood and affect.    Labs reviewed: Basic Metabolic Panel:  Recent Labs  01/03/15 0413 01/04/15 0520 01/10/15  NA 126* 125* 130*  K 4.1 3.9 4.5  CL 87* 93*  --   CO2 26 28  --   GLUCOSE 111* 121*  --   BUN 22 16 25*  CREATININE 1.28* 1.00 1.1  CALCIUM 8.0* 7.7*  --  CBC:  Recent Labs  01/03/15 0413 01/04/15 0520 01/10/15 01/17/15  WBC 5.4 6.1 5.1 5.7  HGB 7.8* 7.2* 7.5* 8.4*  HCT 22.4* 21.1* 23* 26*  MCV 92.9 90.6  --   --   PLT 204 228 435* 357   Past Procedures: No new imaging during hospitalization  Assessment/Plan 1. Primary osteoarthritis of right hip -s/p surgery  2. S/P total hip arthroplasty -met goals with therapy, pain controlled, ambulating with a walker. Current not using pain meds. Will d/c resident to home with home PT and OT, f/u with ortho for apt  3. Spinal stenosis of lumbar region -does not complain of pain related to this at this point and her home gabapentin was continued  4. Hyponatremia -improved off HCTZ at 130 on 01/10/15  5. Right foot drop -continues to use walker for ambulation, denies pain  6. Constipation  -cont senna s while on iron  7. Essential hypertension, benign Slightly above goal at times, continue current meds and f/u with PCP  8.Blood loss anemia Improved. Continue ferrous sulfate 368m daily and f/u with Dr. ADagmar Haitin 1 mth    CCindi Carbon [phone]

## 2015-01-25 ENCOUNTER — Encounter: Payer: Self-pay | Admitting: Adult Health

## 2015-10-15 ENCOUNTER — Other Ambulatory Visit: Payer: Self-pay | Admitting: Orthopedic Surgery

## 2015-10-15 DIAGNOSIS — M546 Pain in thoracic spine: Secondary | ICD-10-CM

## 2015-10-16 ENCOUNTER — Ambulatory Visit
Admission: RE | Admit: 2015-10-16 | Discharge: 2015-10-16 | Disposition: A | Discharge status: Home / Self Care | Payer: Medicare Other | Source: Ambulatory Visit | Attending: Orthopedic Surgery | Admitting: Orthopedic Surgery

## 2015-10-16 DIAGNOSIS — M546 Pain in thoracic spine: Secondary | ICD-10-CM

## 2015-10-18 ENCOUNTER — Other Ambulatory Visit (HOSPITAL_COMMUNITY): Payer: Self-pay | Admitting: Interventional Radiology

## 2015-10-18 DIAGNOSIS — IMO0002 Reserved for concepts with insufficient information to code with codable children: Secondary | ICD-10-CM

## 2015-10-18 DIAGNOSIS — M549 Dorsalgia, unspecified: Secondary | ICD-10-CM

## 2015-10-21 ENCOUNTER — Other Ambulatory Visit: Payer: Self-pay | Admitting: Radiology

## 2015-10-22 ENCOUNTER — Ambulatory Visit (HOSPITAL_COMMUNITY)
Admission: RE | Admit: 2015-10-22 | Discharge: 2015-10-22 | Disposition: A | Discharge status: Home / Self Care | Payer: Medicare Other | Source: Ambulatory Visit | Attending: Interventional Radiology | Admitting: Interventional Radiology

## 2015-10-22 ENCOUNTER — Encounter (HOSPITAL_COMMUNITY): Payer: Self-pay

## 2015-10-22 ENCOUNTER — Non-Acute Institutional Stay (SKILLED_NURSING_FACILITY): Payer: Medicare Other | Admitting: Internal Medicine

## 2015-10-22 ENCOUNTER — Other Ambulatory Visit (HOSPITAL_COMMUNITY): Payer: Self-pay | Admitting: Interventional Radiology

## 2015-10-22 DIAGNOSIS — K219 Gastro-esophageal reflux disease without esophagitis: Secondary | ICD-10-CM | POA: Diagnosis not present

## 2015-10-22 DIAGNOSIS — I341 Nonrheumatic mitral (valve) prolapse: Secondary | ICD-10-CM | POA: Insufficient documentation

## 2015-10-22 DIAGNOSIS — M1990 Unspecified osteoarthritis, unspecified site: Secondary | ICD-10-CM | POA: Diagnosis not present

## 2015-10-22 DIAGNOSIS — M21371 Foot drop, right foot: Secondary | ICD-10-CM

## 2015-10-22 DIAGNOSIS — H3530 Unspecified macular degeneration: Secondary | ICD-10-CM | POA: Diagnosis not present

## 2015-10-22 DIAGNOSIS — IMO0002 Reserved for concepts with insufficient information to code with codable children: Secondary | ICD-10-CM

## 2015-10-22 DIAGNOSIS — Z7982 Long term (current) use of aspirin: Secondary | ICD-10-CM | POA: Insufficient documentation

## 2015-10-22 DIAGNOSIS — I10 Essential (primary) hypertension: Secondary | ICD-10-CM | POA: Insufficient documentation

## 2015-10-22 DIAGNOSIS — S22060A Wedge compression fracture of T7-T8 vertebra, initial encounter for closed fracture: Secondary | ICD-10-CM | POA: Diagnosis not present

## 2015-10-22 DIAGNOSIS — M4854XA Collapsed vertebra, not elsewhere classified, thoracic region, initial encounter for fracture: Secondary | ICD-10-CM | POA: Diagnosis present

## 2015-10-22 DIAGNOSIS — Z87891 Personal history of nicotine dependence: Secondary | ICD-10-CM | POA: Insufficient documentation

## 2015-10-22 DIAGNOSIS — M48061 Spinal stenosis, lumbar region without neurogenic claudication: Secondary | ICD-10-CM

## 2015-10-22 DIAGNOSIS — M4806 Spinal stenosis, lumbar region: Secondary | ICD-10-CM | POA: Diagnosis not present

## 2015-10-22 DIAGNOSIS — M549 Dorsalgia, unspecified: Secondary | ICD-10-CM

## 2015-10-22 DIAGNOSIS — K5903 Drug induced constipation: Secondary | ICD-10-CM | POA: Diagnosis not present

## 2015-10-22 DIAGNOSIS — Z9889 Other specified postprocedural states: Secondary | ICD-10-CM

## 2015-10-22 LAB — CBC
HEMATOCRIT: 34 % — AB (ref 36.0–46.0)
HEMOGLOBIN: 11.8 g/dL — AB (ref 12.0–15.0)
MCH: 32.6 pg (ref 26.0–34.0)
MCHC: 34.7 g/dL (ref 30.0–36.0)
MCV: 93.9 fL (ref 78.0–100.0)
Platelets: 198 10*3/uL (ref 150–400)
RBC: 3.62 MIL/uL — AB (ref 3.87–5.11)
RDW: 13.3 % (ref 11.5–15.5)
WBC: 8.1 10*3/uL (ref 4.0–10.5)

## 2015-10-22 LAB — BASIC METABOLIC PANEL
ANION GAP: 12 (ref 5–15)
BUN: 12 mg/dL (ref 6–20)
CALCIUM: 9.3 mg/dL (ref 8.9–10.3)
CO2: 30 mmol/L (ref 22–32)
Chloride: 86 mmol/L — ABNORMAL LOW (ref 101–111)
Creatinine, Ser: 0.91 mg/dL (ref 0.44–1.00)
GFR, EST NON AFRICAN AMERICAN: 54 mL/min — AB (ref >60)
Glucose, Bld: 120 mg/dL — ABNORMAL HIGH (ref 65–99)
POTASSIUM: 3.4 mmol/L — AB (ref 3.5–5.1)
Sodium: 128 mmol/L — ABNORMAL LOW (ref 135–145)

## 2015-10-22 LAB — PROTIME-INR
INR: 1.05 (ref 0.00–1.49)
PROTHROMBIN TIME: 13.9 s (ref 11.6–15.2)

## 2015-10-22 LAB — APTT: APTT: 33 s (ref 24–37)

## 2015-10-22 MED ORDER — IOHEXOL 300 MG/ML  SOLN
50.0000 mL | Freq: Once | INTRAMUSCULAR | Status: DC | PRN
  Administered 2015-10-22: 4 mL
  Filled 2015-10-22: qty 50

## 2015-10-22 MED ORDER — HYDROMORPHONE HCL 1 MG/ML IJ SOLN
INTRAMUSCULAR | Status: AC
  Filled 2015-10-22: qty 1

## 2015-10-22 MED ORDER — SODIUM CHLORIDE 0.9 % IV SOLN
Freq: Once | INTRAVENOUS | Status: AC
  Administered 2015-10-22: 11:00:00 via INTRAVENOUS

## 2015-10-22 MED ORDER — CEFAZOLIN SODIUM-DEXTROSE 2-3 GM-% IV SOLR
INTRAVENOUS | Status: AC
  Filled 2015-10-22: qty 50

## 2015-10-22 MED ORDER — BUPIVACAINE HCL (PF) 0.25 % IJ SOLN
INTRAMUSCULAR | Status: AC
  Filled 2015-10-22: qty 30

## 2015-10-22 MED ORDER — SODIUM CHLORIDE 0.9 % IV SOLN: INTRAVENOUS | Status: AC

## 2015-10-22 MED ORDER — FENTANYL CITRATE (PF) 100 MCG/2ML IJ SOLN
INTRAMUSCULAR | Status: AC
  Filled 2015-10-22: qty 2

## 2015-10-22 MED ORDER — MIDAZOLAM HCL 2 MG/2ML IJ SOLN
INTRAMUSCULAR | Status: AC
  Filled 2015-10-22: qty 2

## 2015-10-22 MED ORDER — CEFAZOLIN SODIUM-DEXTROSE 2-3 GM-% IV SOLR
2.0000 g | INTRAVENOUS | Status: AC
  Administered 2015-10-22: 2 g via INTRAVENOUS

## 2015-10-22 MED ORDER — MIDAZOLAM HCL 2 MG/2ML IJ SOLN
INTRAMUSCULAR | Status: AC | PRN
  Administered 2015-10-22: 1 mg via INTRAVENOUS

## 2015-10-22 MED ORDER — TOBRAMYCIN SULFATE 1.2 G IJ SOLR
INTRAMUSCULAR | Status: AC
  Filled 2015-10-22: qty 1.2

## 2015-10-22 MED ORDER — FENTANYL CITRATE (PF) 100 MCG/2ML IJ SOLN
INTRAMUSCULAR | Status: AC | PRN
  Administered 2015-10-22: 25 ug via INTRAVENOUS

## 2015-10-22 NOTE — Discharge Instructions (Signed)
1. No stooping,bending or lifting more than 10 lbs for 2 weeks. °2.Use walker to ambulate for 2 weeks . °3.RTC in 2 weeks °KYPHOPLASTY/VERTEBROPLASTY DISCHARGE INSTRUCTIONS ° °Medications: (check all that apply) ° °   Resume all home medications as before procedure. °   °           °  °Continue your pain medications as prescribed as needed.  Over the next 3-5 days, decrease your pain medication as tolerated.  Over the counter medications (i.e. Tylenol, ibuprofen, and aleve) may be substituted once severe/moderate pain symptoms have subsided. ° ° Wound Care: °- Bandages may be removed the day following your procedure.  You may get your incision wet once bandages are removed.  Bandaids may be used to cover the incisions until scab formation.  Topical ointments are optional. ° °- If you develop a fever greater than 101 degrees, have increased skin redness at the incision sites or pus-like oozing from incisions occurring within 1 week of the procedure, contact radiology at [phone] or [phone]. ° °- Ice pack to back for 15-20 minutes 2-3 time per day for first 2-3 days post procedure.  The ice will expedite muscle healing and help with the pain from the incisions. ° ° Activity: °- Bedrest today with limited activity for 24 hours post procedure. ° °- No driving for 48 hours. ° °- Increase your activity as tolerated after bedrest (with assistance if necessary). ° °- Refrain from any strenuous activity or heavy lifting (greater than 10 lbs.). ° ° Follow up: °- Contact radiology at [phone] or [phone] if any questions/concerns. ° °- A physician assistant from radiology will contact you in approximately 1 week. ° °- If a biopsy was performed at the time of your procedure, your referring physician should receive the results in usually 2-3 days. ° ° ° ° ° ° ° ° °

## 2015-10-22 NOTE — H&P (Signed)
History of Present Illness: [name] is a 81 y.o. female with complaints of mid back pain since 10/12/15 with no known injury. She states her pain is currently 1/10 but at times can be much more severe. She states her pain radiates anterior bilaterally. She denies any extremity weakness or loss of bowel or bladder function. She states the pain is uncontrolled with pain medication and has interfered with her daily activities, she uses a walker and cane to ambulate. MRI done on 10/16/15 revealed acute/subacute Thoracic level 8 compression fracture. Dr. Corliss Skains has reviewed her images and she has been scheduled today for image guided T8 Vertebroplasty/Kyphoplasty. She denies any chest pain, shortness of breath or palpitations. She denies any active signs of bleeding or excessive bruising. She denies any recent fever or chills. The patient denies any history of sleep apnea or chronic oxygen use. She has previously tolerated sedation without complications.    Past Medical History  Diagnosis Date  . Arthritis   . Osteoporosis   . Essential hypertension, benign   . Osteoarthrosis, unspecified whether generalized or localized, unspecified site   . Other malaise and fatigue     re: stress, anxiety  . Allergic rhinitis, cause unspecified   . Osteoporosis/osteopenia increased risk   . Macular degeneration   . Venous insufficiency   . Cataract cortical, senile 2010    s/p bil extract/IOL implants   . Degenerative disk disease 06/2013  . Spinal stenosis of lumbar region 07/08/2013  . Hyponatremia 07/19/2013  . Right foot drop 08/08/2013  . Mitral valve prolapse   . GERD (gastroesophageal reflux disease)   . Constipation   . Pneumonia 1920's    Past Surgical History  Procedure Laterality Date  . Total hip arthroplasty Left 2005  . Replacement total knee Right 2010  . Tonsillectomy    . Colonoscopy    . Total hip arthroplasty Right 01/02/2015  . Joint replacement    . Cataract extraction  w/ intraocular lens  implant, bilateral Bilateral   . Total hip arthroplasty Right 01/02/2015    Procedure: TOTAL HIP ARTHROPLASTY;  Surgeon: Nadara Mustard, MD;  Location: [facility];  Service: Orthopedics;  Laterality: Right;    Allergies: Review of patient's allergies indicates no known allergies.  Medications: Prior to Admission medications   Medication Sig Start Date End Date Taking? Authorizing Provider  acetaminophen (TYLENOL) 650 MG CR tablet Take 650 mg by mouth every 6 (six) hours as needed for pain.   Yes Historical Provider, MD  amLODipine (NORVASC) 10 MG tablet Take 1 tablet (10 mg total) by mouth daily. 07/21/13  Yes Nishant Dhungel, MD  aspirin EC 81 MG tablet Take 81 mg by mouth daily.   Yes Historical Provider, MD  beta carotene w/minerals (OCUVITE) tablet Take 1 tablet by mouth daily.   Yes Historical Provider, MD  diphenhydrAMINE (BENADRYL) 25 MG tablet Take 50 mg by mouth at bedtime.   Yes Historical Provider, MD  ergocalciferol (VITAMIN D2) 50000 UNITS capsule Take 50,000 Units by mouth once a week. On saturday   Yes Historical Provider, MD  gabapentin (NEURONTIN) 300 MG capsule Take 300 mg by mouth 2 (two) times daily.    Yes Nadara Mustard, MD  meperidine (DEMEROL) 50 MG tablet Take 100 mg by mouth every 6 (six) hours as needed for severe pain.   Yes Historical Provider, MD  olmesartan (BENICAR) 20 MG tablet Take 20 mg by mouth daily.   Yes Historical Provider, MD  senna Mancel Parsons)  8.6 MG TABS tablet Take 1 tablet by mouth at bedtime. May increase to 2 tabs qhs if no result   Yes Historical Provider, MD  simethicone (MYLICON) 80 MG chewable tablet Chew 80 mg by mouth every 6 (six) hours as needed for flatulence.   Yes Historical Provider, MD  traMADol (ULTRAM) 50 MG tablet One tablet every 6 hours as needed for pain 1-5 and two tablets every 6 hours as needed for pain 6-10   Yes Historical Provider, MD  DULoxetine (CYMBALTA) 60 MG capsule Take 60 mg by mouth daily.    Historical  Provider, MD     Family History  Problem Relation Age of Onset  . Esophageal cancer Mother 20  . Breast cancer Daughter   . Thyroid disease Daughter     Social History   Social History  . Marital Status: Widowed    Spouse Name: N/A  . Number of Children: N/A  . Years of Education: college   Social History Main Topics  . Smoking status: Former Smoker -- 0.50 packs/day for 20 years    Types: Cigarettes    Quit date: 07/07/1963  . Smokeless tobacco: Never Used  . Alcohol Use: 8.4 oz/week    14 Shots of liquor per week     Comment: drinks scotch daily.   . Drug Use: No  . Sexual Activity: No   Other Topics Concern  . None   Social History Narrative    Widowed 1988.  29yr college education.  Patient lives in  single level home at [facility] since 2003. Lives alone, continues to drive, attends social activities. Stopped smoking many years ago, drinks daily alcohol, 3oz. Scotch.   Patient has Advanced planning documents: Living Will, DNR            Review of Systems: A 12 point ROS discussed and pertinent positives are indicated in the HPI above.  All other systems are negative.  Review of Systems  Vital Signs: BP 147/57 mmHg  Pulse 89  Temp(Src) 98 F (36.7 C) (Oral)  Resp 18  Ht  (1.6 m)  Wt 137 lb (62.143 kg)  BMI 24.27 kg/m2  SpO2 92%  Physical Exam  Constitutional: She is oriented to person, place, and time. No distress.  HENT:  Head: Normocephalic and atraumatic.  Cardiovascular: Normal rate and regular rhythm.  Exam reveals no gallop and no friction rub.   No murmur heard. Pulmonary/Chest: Effort normal and breath sounds normal. No respiratory distress. She has no wheezes. She has no rales.  Abdominal: Soft. Bowel sounds are normal.  Musculoskeletal:  TTP T8 region, no skin changes  Neurological: She is alert and oriented to person, place, and time.  Skin: Skin is warm and dry. She is not diaphoretic.    Mallampati  Score:  MD Evaluation Airway: WNL Heart: WNL Abdomen: WNL Chest/ Lungs: WNL ASA  Classification: 3 Mallampati/Airway Score: Two  Imaging: Mr Thoracic Spine Wo Contrast  10/16/2015  CLINICAL DATA:  Mid back pain, difficulty ambulating, right foot drop EXAM: MRI THORACIC SPINE WITHOUT CONTRAST TECHNIQUE: Multiplanar, multisequence MR imaging of the thoracic spine was performed. No intravenous contrast was administered. COMPARISON:  None. FINDINGS: There is a severe T8 vertebral body compression fracture with greater than 90% anterior height loss. There is mild marrow edema within the vertebral body. There is 4.8 mm of retropulsion of the superior posterior margin of the T8 vertebral body impressing upon the thecal sac resulting in mild spinal stenosis. The remainder  the vertebral body heights are maintained. The alignment is anatomic. There is no static listhesis. The marrow signal is normal. The thoracic spinal cord is normal in size and signal. The disc spaces are maintained. T1-T2: No significant disc bulge, foraminal stenosis or central canal stenosis. T2-T3: No significant disc bulge, foraminal stenosis or central canal stenosis. T3-T4: No significant disc bulge, foraminal stenosis or central canal stenosis. T4-T5: No significant disc bulge, foraminal stenosis or central canal stenosis. T5-T6: No significant disc bulge, foraminal stenosis or central canal stenosis. T6-T7: No significant disc bulge, foraminal stenosis or central canal stenosis. T7-T8: No significant disc bulge, foraminal stenosis or central canal stenosis. T8-T9: Small left paracentral disc protrusion. No foraminal or central canal stenosis. T9-T10: No significant disc bulge, foraminal stenosis or central canal stenosis. T10-T11: No significant disc bulge, foraminal stenosis or central canal stenosis. T11-T12: No significant disc bulge, foraminal stenosis or central canal stenosis. T12-L1:  Mild broad-based disc bulge. IMPRESSION: 1.  Acute - subacute T8 vertebral body compression fracture with mild marrow edema throughout the vertebral body and greater than 90% anterior height loss. Mild retropulsion of the superior posterior margin of the T8 vertebral body resulting in mild spinal stenosis. Electronically Signed   By: Elige KoHetal  Patel   On: 10/16/2015 13:01    Labs:  CBC:  Recent Labs  01/04/15 0520 01/10/15 01/17/15 10/22/15 1109  WBC 6.1 5.1 5.7 8.1  HGB 7.2* 7.5* 8.4* 11.8*  HCT 21.1* 23* 26* 34.0*  PLT 228 435* 357 198    COAGS:  Recent Labs  12/24/14 1512 10/22/15 1109  INR 0.97 1.05  APTT 30 33    BMP:  Recent Labs  01/03/15 0413 01/04/15 0520 01/10/15 10/22/15 1109  NA 126* 125* 130* 128*  K 4.1 3.9 4.5 3.4*  CL 87* 93*  --  86*  CO2 26 28  --  30  GLUCOSE 111* 121*  --  120*  BUN 22 16 25* 12  CALCIUM 8.0* 7.7*  --  9.3  CREATININE 1.28* 1.00 1.1 0.91  GFRNONAA 36* 49*  --  54*  GFRAA 42* 57*  --  >60    Assessment and Plan: Back pain since 10/12/15, no known injury- uncontrolled with pain medication MRI 10/16/15 with acute/subacute Thoracic level 8 compression fracture, physical exam with TTP in that region  Scheduled today for image guided T8 Vertebroplasty/Kyphoplasty with sedation The patient has been NPO, no blood thinners taken, imaging, labs and vitals have been reviewed. Risks and Benefits discussed with the patient including, but not limited to education regarding the natural healing process of compression fractures without intervention, bleeding, infection, cement migration which may cause spinal cord damage, paralysis, pulmonary embolism or even death. All of the patient's questions were answered, patient is agreeable to proceed. Consent signed and in chart.    SignedBerneta Levins: Pat Elicker D 10/22/2015, 11:54 AM

## 2015-10-22 NOTE — Progress Notes (Signed)
Patient ID: [name], female   DOB: Feb 05, 1924, 81 y.o.   MRN: [id number]  Provider:  Gwenith Spitz. Renato Gails, D.O., C.M.D. Location:  [facility] PCP: Hoyle Sauer, MD DOS:  10/23/15  Code Status: DNR Goals of Care: Advanced Directive information Does patient have an advance directive?: Yes, Type of Advance Directive: Healthcare Power of [place];Living will   Chief Complaint  Patient presents with  . New Admit To SNF    for rehab s/p T8 compression fracture    HPI: Patient is a 81 y.o. female seen today for admission to rehab s/p acute T7 compression fracture.  She had severe pain radiating around to her ribs anteriorly on the right.  She underwent kyphoplasty earlier today.  She notes that she now has a different type of pain that feels like someone hammered her lower back.  It is not tender, but internal pain.  She says it is controlled with her current regimen.  She was drinking a toddy when I saw her in the early evening just before the evening meal.  She had no other concerns and was alert and oriented x 3.  She is looking forward to working with therapy and getting back home.   Review of Systems  Constitutional: Negative for fever, chills and malaise/fatigue.  HENT: Negative for congestion.   Eyes:       Macular degeneration  Respiratory: Negative for shortness of breath.   Cardiovascular: Negative for chest pain and leg swelling.  Gastrointestinal: Positive for constipation.       Controlled  Genitourinary: Negative for dysuria.  Musculoskeletal: Positive for back pain and falls.  Skin: Negative for rash.       Friable skin  Neurological: Negative for dizziness, loss of consciousness and weakness.  Psychiatric/Behavioral: Negative for depression and memory loss.    Past Medical History  Diagnosis Date  . Arthritis   . Osteoporosis   . Essential hypertension, benign   . Osteoarthrosis, unspecified whether generalized or localized, unspecified site   . Other malaise and  fatigue     re: stress, anxiety  . Allergic rhinitis, cause unspecified   . Osteoporosis/osteopenia increased risk   . Macular degeneration   . Venous insufficiency   . Cataract cortical, senile 2010    s/p bil extract/IOL implants   . Degenerative disk disease 06/2013  . Spinal stenosis of lumbar region 07/08/2013  . Hyponatremia 07/19/2013  . Right foot drop 08/08/2013  . Mitral valve prolapse   . GERD (gastroesophageal reflux disease)   . Constipation   . Pneumonia 1920's   Past Surgical History  Procedure Laterality Date  . Total hip arthroplasty Left 2005  . Replacement total knee Right 2010  . Tonsillectomy    . Colonoscopy    . Total hip arthroplasty Right 01/02/2015  . Joint replacement    . Cataract extraction w/ intraocular lens  implant, bilateral Bilateral   . Total hip arthroplasty Right 01/02/2015    Procedure: TOTAL HIP ARTHROPLASTY;  Surgeon: Nadara Mustard, MD;  Location: [facility] OR;  Service: Orthopedics;  Laterality: Right;    reports that she quit smoking about 52 years ago. Her smoking use included Cigarettes. She has a 10 pack-year smoking history. She has never used smokeless tobacco. She reports that she drinks about 8.4 oz of alcohol per week. She reports that she does not use illicit drugs. Family History  Problem Relation Age of Onset  . Esophageal cancer Mother 10  . Breast cancer Daughter   . Thyroid  disease Daughter     No Known Allergies    Medication List       This list is accurate as of: 10/22/15 11:59 PM.  Always use your most recent med list.               acetaminophen 650 MG CR tablet  Commonly known as:  TYLENOL  Take 650 mg by mouth every 6 (six) hours as needed for pain.     amLODipine 10 MG tablet  Commonly known as:  NORVASC  Take 1 tablet (10 mg total) by mouth daily.     aspirin EC 81 MG tablet  Take 81 mg by mouth daily.     beta carotene w/minerals tablet  Take 1 tablet by mouth daily.     diphenhydrAMINE 25 MG tablet    Commonly known as:  BENADRYL  Take 50 mg by mouth at bedtime.     DULoxetine 60 MG capsule  Commonly known as:  CYMBALTA  Take 60 mg by mouth daily.     ergocalciferol 50000 UNITS capsule  Commonly known as:  VITAMIN D2  Take 50,000 Units by mouth once a week. On saturday     gabapentin 300 MG capsule  Commonly known as:  NEURONTIN  Take 300 mg by mouth 2 (two) times daily.     olmesartan 20 MG tablet  Commonly known as:  BENICAR  Take 10 mg by mouth daily.     senna 8.6 MG Tabs tablet  Commonly known as:  SENOKOT  Take 1 tablet by mouth at bedtime. May increase to 2 tabs qhs if no result     simethicone 80 MG chewable tablet  Commonly known as:  MYLICON  Chew 80 mg by mouth every 6 (six) hours as needed for flatulence.     traMADol 50 MG tablet  Commonly known as:  ULTRAM  One tablet every 6 hours as needed for pain 1-5 and two tablets every 6 hours as needed for pain 6-10        Physical Exam: Filed Vitals:   10/22/15 1016  BP: 145/80  Pulse: 81  Temp: 98.5 F (36.9 C)  Resp: 18  Height: 5\' 3"  (1.6 m)  Weight: 135 lb 6.4 oz (61.417 kg)  SpO2: 94%   Body mass index is 23.99 kg/(m^2). Physical Exam  Constitutional: She is oriented to person, place, and time. She appears well-developed and well-nourished. No distress.  HENT:  Head: Normocephalic and atraumatic.  Right Ear: External ear normal.  Left Ear: External ear normal.  Nose: Nose normal.  Mouth/Throat: Oropharynx is clear and moist.  Eyes: Conjunctivae and EOM are normal. Pupils are equal, round, and reactive to light.  Neck: Neck supple. No JVD present.  Cardiovascular: Normal rate and regular rhythm.   Midsystolic click  Pulmonary/Chest: Effort normal and breath sounds normal. No respiratory distress.  Abdominal: Soft. Bowel sounds are normal. She exhibits no distension. There is no tenderness.  Musculoskeletal: Normal range of motion. She exhibits no tenderness.  Lymphadenopathy:    She has no  cervical adenopathy.  Neurological: She is alert and oriented to person, place, and time.  Skin:  Thin skin; chronic venous insufficiency of bilateral LEs with hyperpigmentation  Psychiatric: She has a normal mood and affect.    Labs reviewed: Basic Metabolic Panel:  Recent Labs  82/95/62 0413 01/04/15 0520 01/10/15 10/22/15 1109  NA 126* 125* 130* 128*  K 4.1 3.9 4.5 3.4*  CL 87* 93*  --  86*  CO2 26 28  --  30  GLUCOSE 111* 121*  --  120*  BUN 22 16 25* 12  CREATININE 1.28* 1.00 1.1 0.91  CALCIUM 8.0* 7.7*  --  9.3   Liver Function Tests: No results for input(s): AST, ALT, ALKPHOS, BILITOT, PROT, ALBUMIN in the last 8760 hours. No results for input(s): LIPASE, AMYLASE in the last 8760 hours. No results for input(s): AMMONIA in the last 8760 hours. CBC:  Recent Labs  01/03/15 0413 01/04/15 0520 01/10/15 01/17/15 10/22/15 1109  WBC 5.4 6.1 5.1 5.7 8.1  HGB 7.8* 7.2* 7.5* 8.4* 11.8*  HCT 22.4* 21.1* 23* 26* 34.0*  MCV 92.9 90.6  --   --  93.9  PLT 204 228 435* 357 198   Cardiac Enzymes: No results for input(s): CKTOTAL, CKMB, CKMBINDEX, TROPONINI in the last 8760 hours. BNP: Invalid input(s): POCBNP No results found for: HGBA1C Lab Results  Component Value Date   TSH 2.047 07/20/2013   No results found for: VITAMINB12 No results found for: FOLATE No results found for: IRON, TIBC, FERRITIN  Imaging and Procedures obtained prior to SNF admission:  MRI thoracic spine w/o contrast 10/16/15:  1. Acute - subacute T8 vertebral body compression fracture with mild marrow edema throughout the vertebral body and greater than 90% anterior height loss. Mild retropulsion of the superior posterior margin of the T8 vertebral body resulting in mild spinal stenosis.   Assessment/Plan 1. Closed wedge compression fracture of eighth thoracic vertebra, initial encounter (HCC) --just had procedure today and now has a different pain in her lower back like someone used a hammer  back there -says pain is under good control though at present (?med may not be worn off yet from the kyphoplasty) -PT, OT ordered  2. S/P kyphoplasty -as above -cont tramadol, tylenol for pain  3. Constipation due to pain medication -bowels have been moving with use of prune juice, simethicone for flatus, but expect she'll need some of the standing order medications during this time  4. Spinal stenosis of lumbar region -has chronic pain related to this also -apparently stopped her cymbalta at some point -cont tramadol, neurontin for neuropathic pain and tylenol for mild pain  5. Right foot drop -due to #4 -interferes with her gait at times -cont use of walker  6. Essential hypertension, benign -well controlled -cont benicar, amlodipine  Functional status: is independent in ADLs  Family/ staff Communication: discussed with rehab RN and nurse manager  Labs/tests ordered:  None needed at present  [name], D.O. Geriatrics [facility] [address], [place] [address] Cell Phone (Mon-Fri 8am-5pm):  [phone] On Call:  [phone] & follow prompts after 5pm & weekends Office Phone:  [phone] Office Fax:  [phone]

## 2015-10-22 NOTE — Procedures (Signed)
S/P T 8 VP 

## 2015-10-24 ENCOUNTER — Non-Acute Institutional Stay (SKILLED_NURSING_FACILITY): Payer: Medicare Other | Admitting: Adult Health

## 2015-10-24 ENCOUNTER — Other Ambulatory Visit (HOSPITAL_COMMUNITY): Payer: Self-pay | Admitting: Interventional Radiology

## 2015-10-24 ENCOUNTER — Encounter: Payer: Self-pay | Admitting: Adult Health

## 2015-10-24 DIAGNOSIS — IMO0002 Reserved for concepts with insufficient information to code with codable children: Secondary | ICD-10-CM

## 2015-10-24 DIAGNOSIS — S22000D Wedge compression fracture of unspecified thoracic vertebra, subsequent encounter for fracture with routine healing: Secondary | ICD-10-CM | POA: Diagnosis not present

## 2015-10-24 DIAGNOSIS — F05 Delirium due to known physiological condition: Secondary | ICD-10-CM

## 2015-10-24 NOTE — Progress Notes (Signed)
Patient ID: [name], female   DOB: Nov 05, 1924, 81 y.o.   MRN: [id number]     Nursing Home Location:  [facility]   Code Status: DNR  Patient Care Team: Chilton Greathouseavisankar Avva, MD as PCP - General (Internal Medicine) [facility] Nadara MustardMarcus Duda V, MD as Consulting Physician (Orthopedic Surgery)   Place of Service: SNF (31)  Chief Complaint  Patient presents with  . Acute Visit    increased confusion    HPI:  81 [facility]. I was asked to see her today due to increased confusion noted after administering demerol for pain. She was admitted on 10/29 after seeing her PCP and having back pain. She had an xray showing a T8 compression fracture. She is on prednisone and followed by Dr. Lajoyce Cornersuda. She had a vertebroplasty on 11/1 for this and currently has a pain of 5/10 to the thoracic region. She has not had a cough, fever, congestion, dysuria, or difficulty having bm's.  She is ambulating with a walker.      Review of Systems:  Review of Systems  Constitutional: Negative for fever, chills, diaphoresis, activity change, appetite change and fatigue.  HENT: Negative for congestion.   Respiratory: Negative for cough and shortness of breath.   Cardiovascular: Negative for chest pain and leg swelling.  Gastrointestinal: Negative for nausea, abdominal pain, diarrhea, constipation and abdominal distention.  Genitourinary: Negative for dysuria and difficulty urinating.  Musculoskeletal: Positive for back pain, arthralgias and gait problem.  Neurological: Negative for syncope, speech difficulty, weakness, light-headedness and numbness.  Psychiatric/Behavioral: Positive for confusion. Negative for behavioral problems and agitation.    Medications: Patient's Medications  New Prescriptions   No medications on file  Previous Medications   ACETAMINOPHEN (TYLENOL) 650 MG CR TABLET    Take 650 mg by mouth  every 6 (six) hours as needed for pain.   AMLODIPINE (NORVASC) 10 MG TABLET    Take 1 tablet (10 mg total) by mouth daily.   ASPIRIN EC 81 MG TABLET    Take 81 mg by mouth daily.   BETA CAROTENE W/MINERALS (OCUVITE) TABLET    Take 1 tablet by mouth daily.   DULOXETINE (CYMBALTA) 60 MG CAPSULE    Take 60 mg by mouth daily.   ERGOCALCIFEROL (VITAMIN D2) 50000 UNITS CAPSULE    Take 50,000 Units by mouth once a week. On saturday   GABAPENTIN (NEURONTIN) 300 MG CAPSULE    Take 300 mg by mouth 2 (two) times daily.    HYDROCHLOROTHIAZIDE (MICROZIDE) 12.5 MG CAPSULE    Take 12.5 mg by mouth daily.   HYDROCODONE-ACETAMINOPHEN (NORCO/VICODIN) 5-325 MG TABLET    Take 1-2 tablets by mouth every 6 (six) hours as needed for moderate pain.   OLMESARTAN (BENICAR) 20 MG TABLET    Take 10 mg by mouth daily.    PREDNISONE (DELTASONE) 20 MG TABLET    Take 20 mg by mouth daily with breakfast.   SENNA (SENOKOT) 8.6 MG TABS TABLET    Take 1 tablet by mouth at bedtime. May increase to 2 tabs qhs if no result  Modified Medications   No medications on file  Discontinued Medications   DIPHENHYDRAMINE (BENADRYL) 25 MG TABLET    Take 50 mg by mouth at bedtime.   MEPERIDINE (DEMEROL) 50 MG TABLET    Take 100 mg by mouth every 6 (six) hours as needed for severe pain.   SIMETHICONE (MYLICON) 80 MG CHEWABLE TABLET  Chew 80 mg by mouth every 6 (six) hours as needed for flatulence.   TRAMADOL (ULTRAM) 50 MG TABLET    One tablet every 6 hours as needed for pain 1-5 and two tablets every 6 hours as needed for pain 6-10     Physical Exam:  Filed Vitals:   10/24/15 1515  BP: 123/67  Pulse: 72  Temp: 97.2 F (36.2 C)  Resp: 20  SpO2: 94%    Physical Exam  Constitutional: She is oriented to person, place, and time. No distress.  HENT:  Head: Normocephalic and atraumatic.  Eyes: Pupils are equal, round, and reactive to light.  Neck: No JVD present.  Cardiovascular: Normal rate and regular rhythm.   No murmur  heard. No edema, dark discoloration to both lower ext  Pulmonary/Chest: Breath sounds normal. No respiratory distress. She has no wheezes.  Abdominal: Soft. Bowel sounds are normal. She exhibits no distension.  Musculoskeletal: She exhibits no edema.  Incision to mid back with no erythema or drainage, band aid in place  Lymphadenopathy:    She has no cervical adenopathy.  Neurological: She is alert and oriented to person, place, and time. No cranial nerve deficit.  Difficulty finding words and has dry mouth  Skin: Skin is warm and dry. She is not diaphoretic.  Psychiatric: Affect normal.    Wt Readings from Last 3 Encounters:  10/22/15 137 lb (62.143 kg)  01/24/15 138 lb 12.8 oz (62.959 kg)  01/08/15 131 lb 12.8 oz (59.784 kg)     Labs reviewed/Significant Diagnostic Results:  Basic Metabolic Panel:  Recent Labs  16/10/96 0413 01/04/15 0520 01/10/15 10/22/15 1109  NA 126* 125* 130* 128*  K 4.1 3.9 4.5 3.4*  CL 87* 93*  --  86*  CO2 26 28  --  30  GLUCOSE 111* 121*  --  120*  BUN 22 16 25* 12  CREATININE 1.28* 1.00 1.1 0.91  CALCIUM 8.0* 7.7*  --  9.3   Liver Function Tests: No results for input(s): AST, ALT, ALKPHOS, BILITOT, PROT, ALBUMIN in the last 8760 hours. No results for input(s): LIPASE, AMYLASE in the last 8760 hours. No results for input(s): AMMONIA in the last 8760 hours. CBC:  Recent Labs  01/03/15 0413 01/04/15 0520 01/10/15 01/17/15 10/22/15 1109  WBC 5.4 6.1 5.1 5.7 8.1  HGB 7.8* 7.2* 7.5* 8.4* 11.8*  HCT 22.4* 21.1* 23* 26* 34.0*  MCV 92.9 90.6  --   --  93.9  PLT 204 228 435* 357 198   CBG: No results for input(s): GLUCAP in the last 8760 hours. TSH: No results for input(s): TSH in the last 8760 hours. A1C: No results found for: HGBA1C Lipid Panel: No results for input(s): CHOL, HDL, LDLCALC, TRIG, CHOLHDL, LDLDIRECT in the last 8760 hours.     Assessment/Plan  1. Acute confusional state -no neurologic findings or other metabolic  reason for confusion -seems to correlate with admin of demerol -will d/c and try norco 1-2 q 6 hrs prn pain  2. Thoracic compression fracture, with routine healing, subsequent encounter -per IR -may have PT eval and tx      Peggye Ley, ANP [facility] [phone]

## 2015-10-28 ENCOUNTER — Telehealth (HOSPITAL_COMMUNITY): Payer: Self-pay

## 2015-10-28 ENCOUNTER — Other Ambulatory Visit: Payer: Self-pay | Admitting: *Deleted

## 2015-10-28 MED ORDER — MORPHINE SULFATE ER 10 MG PO CP24: ORAL_CAPSULE | ORAL | Status: DC

## 2015-10-28 NOTE — Telephone Encounter (Signed)
[name] from [facility] called on 10/25/15 and left a message for someone to call back regarding [name]. I returned call and [name] stated that they were concerned about pt because she was in so much pain after having procedure with Dr. Corliss Skainseveshwar. She stated that since the weekend they have found out that the pain is not coming from the procedure and was muscle issues and did not need Dr. Corliss Skainseveshwar anymore. I told her that if they had any other concerns to please call back and we would be glad to help. She agreed to do so. AW

## 2015-10-28 NOTE — Telephone Encounter (Signed)
[facility] 

## 2015-10-31 ENCOUNTER — Non-Acute Institutional Stay (SKILLED_NURSING_FACILITY): Payer: Medicare Other | Admitting: Adult Health

## 2015-10-31 ENCOUNTER — Encounter: Payer: Self-pay | Admitting: Adult Health

## 2015-10-31 DIAGNOSIS — E871 Hypo-osmolality and hyponatremia: Secondary | ICD-10-CM

## 2015-10-31 DIAGNOSIS — F3341 Major depressive disorder, recurrent, in partial remission: Secondary | ICD-10-CM | POA: Diagnosis not present

## 2015-10-31 DIAGNOSIS — S22000A Wedge compression fracture of unspecified thoracic vertebra, initial encounter for closed fracture: Secondary | ICD-10-CM | POA: Insufficient documentation

## 2015-10-31 DIAGNOSIS — K5903 Drug induced constipation: Secondary | ICD-10-CM | POA: Diagnosis not present

## 2015-10-31 DIAGNOSIS — E559 Vitamin D deficiency, unspecified: Secondary | ICD-10-CM | POA: Diagnosis not present

## 2015-10-31 DIAGNOSIS — I10 Essential (primary) hypertension: Secondary | ICD-10-CM | POA: Diagnosis not present

## 2015-10-31 DIAGNOSIS — F329 Major depressive disorder, single episode, unspecified: Secondary | ICD-10-CM | POA: Insufficient documentation

## 2015-10-31 DIAGNOSIS — S22000D Wedge compression fracture of unspecified thoracic vertebra, subsequent encounter for fracture with routine healing: Secondary | ICD-10-CM | POA: Diagnosis not present

## 2015-10-31 NOTE — Progress Notes (Signed)
Patient ID: [name], female   DOB: [birth year], 81 y.o.   MRN: [id number]     Nursing Home Location:  [facility]   Code Status: DNR  Patient Care Team: Chilton Greathouse, MD as PCP - General (Internal Medicine) [facility] Nadara Mustard, MD as Consulting Physician (Orthopedic Surgery)   Place of Service: SNF (31)  Chief Complaint  Patient presents with  . Discharge Note    HPI:  81 y.o.female residing at [name], rehab section. I am here to evaluate her for discharge back to independent living. She was admitted on 10/29 after seeing her PCP and having back pain. She had an xray showing a T8 compression fracture.  She had a vertebroplasty on 11/1 for this and has improved pain currently at a 1/10.  She is using morphine oral 7.5 mg as needed for pain.  Also she is on Mobic 7.5 mg for pain, as well as Robaxin 500 mg prn. She reports that she has been on prednisone for over a month (due to spinal stenosis?), started by Dr. Lajoyce Corners. She has worked with therapy and is ambulatory and using a back brace. She feel ready to return home. She is voiding and having BM's.    Of note she was on Demerol during her stay but this was discontinued due to increased confusion.     Review of Systems:  Review of Systems  Constitutional: Negative for fever, chills, diaphoresis, activity change, appetite change and fatigue.  HENT: Negative for congestion.   Respiratory: Negative for cough and shortness of breath.   Cardiovascular: Negative for chest pain and leg swelling.  Gastrointestinal: Negative for nausea, abdominal pain, diarrhea, constipation and abdominal distention.  Genitourinary: Negative for dysuria and difficulty urinating.  Musculoskeletal: Positive for back pain and arthralgias. Negative for gait problem.  Neurological: Negative for syncope, speech difficulty, weakness, light-headedness and numbness.  Psychiatric/Behavioral:  Negative for behavioral problems, confusion and agitation.    Medications: Patient's Medications  New Prescriptions   No medications on file  Previous Medications   ACETAMINOPHEN (TYLENOL) 650 MG CR TABLET    Take 650 mg by mouth every 6 (six) hours as needed for pain.   ALENDRONATE (FOSAMAX) 70 MG TABLET    Take 70 mg by mouth once a week. Take with a full glass of water on an empty stomach.   AMLODIPINE (NORVASC) 10 MG TABLET    Take 1 tablet (10 mg total) by mouth daily.   ASPIRIN EC 81 MG TABLET    Take 81 mg by mouth daily.   BETA CAROTENE W/MINERALS (OCUVITE) TABLET    Take 1 tablet by mouth daily.   CALCIUM CARBONATE (CALCIUM 600) 600 MG TABS TABLET    Take 600 mg by mouth 2 (two) times daily with a meal.   DULOXETINE (CYMBALTA) 60 MG CAPSULE    Take 60 mg by mouth daily.   ERGOCALCIFEROL (VITAMIN D2) 50000 UNITS CAPSULE    Take 50,000 Units by mouth once a week. On saturday   GABAPENTIN (NEURONTIN) 300 MG CAPSULE    Take 300 mg by mouth 2 (two) times daily.    HYDROCHLOROTHIAZIDE (MICROZIDE) 12.5 MG CAPSULE    Take 12.5 mg by mouth daily.   HYDROCODONE-ACETAMINOPHEN (NORCO/VICODIN) 5-325 MG TABLET    Take 1-2 tablets by mouth every 6 (six) hours as needed for moderate pain.   MORPHINE (MSIR) 15 MG TABLET    Take 15 mg by mouth every 4 (four) hours as  needed for severe pain. Take 1/2 tab q 4 prn pain s/p vertebroplasty   OLMESARTAN (BENICAR) 20 MG TABLET    Take 10 mg by mouth daily.    PREDNISONE (DELTASONE) 20 MG TABLET    Take 20 mg by mouth daily with breakfast.   SENNA (SENOKOT) 8.6 MG TABS TABLET    Take 1 tablet by mouth at bedtime. May increase to 2 tabs qhs if no result  Modified Medications   No medications on file  Discontinued Medications   MORPHINE (KADIAN) 10 MG 24 HR CAPSULE    Take one capsule by mouth every 4 hours as needed for pain. Do not crush     Physical Exam:  There were no vitals filed for this visit.  Physical Exam  Constitutional: She is oriented  to person, place, and time. No distress.  HENT:  Head: Normocephalic and atraumatic.  Eyes: Pupils are equal, round, and reactive to light.  Neck: No JVD present.  Cardiovascular: Normal rate and regular rhythm.   No murmur heard. No edema, dark discoloration to both lower ext  Pulmonary/Chest: Breath sounds normal. No respiratory distress. She has no wheezes.  Abdominal: Soft. Bowel sounds are normal. She exhibits no distension.  Musculoskeletal: She exhibits no edema.  Incision to mid back with no erythema or drainage  Lymphadenopathy:    She has no cervical adenopathy.  Neurological: She is alert and oriented to person, place, and time. No cranial nerve deficit.  Difficulty finding words and has dry mouth  Skin: Skin is warm and dry. She is not diaphoretic.  Thin, dry skin to lower ext with venous stasis changes  Psychiatric: Affect normal.    Wt Readings from Last 3 Encounters:  10/22/15 137 lb (62.143 kg)  01/24/15 138 lb 12.8 oz (62.959 kg)  01/08/15 131 lb 12.8 oz (59.784 kg)     Labs reviewed/Significant Diagnostic Results:  Basic Metabolic Panel:  Recent Labs  16/10/96 0413 01/04/15 0520 01/10/15 10/22/15 1109  NA 126* 125* 130* 128*  K 4.1 3.9 4.5 3.4*  CL 87* 93*  --  86*  CO2 26 28  --  30  GLUCOSE 111* 121*  --  120*  BUN 22 16 25* 12  CREATININE 1.28* 1.00 1.1 0.91  CALCIUM 8.0* 7.7*  --  9.3   Liver Function Tests: No results for input(s): AST, ALT, ALKPHOS, BILITOT, PROT, ALBUMIN in the last 8760 hours. No results for input(s): LIPASE, AMYLASE in the last 8760 hours. No results for input(s): AMMONIA in the last 8760 hours. CBC:  Recent Labs  01/03/15 0413 01/04/15 0520 01/10/15 01/17/15 10/22/15 1109  WBC 5.4 6.1 5.1 5.7 8.1  HGB 7.8* 7.2* 7.5* 8.4* 11.8*  HCT 22.4* 21.1* 23* 26* 34.0*  MCV 92.9 90.6  --   --  93.9  PLT 204 228 435* 357 198   CBG: No results for input(s): GLUCAP in the last 8760 hours. TSH: No results for input(s): TSH  in the last 8760 hours. A1C: No results found for: HGBA1C Lipid Panel: No results for input(s): CHOL, HDL, LDLCALC, TRIG, CHOLHDL, LDLDIRECT in the last 8760 hours.     Assessment/Plan  1. Thoracic compression fracture, with routine healing, subsequent encounter -s/p Vertebroplasty -improved pain and function -continues to work with PT and wear brace -may continue prn Morphine and robaxin -f/u with Dr. Titus Dubin  2. Essential hypertension, benign -controlled per records -continue current meds  3. Constipation due to pain medication Improved with senna s two tabs  qhs  4. Recurrent major depressive disorder, in partial remission (HCC) -stable, continue cymbalta  5. Vitamin D deficiency -continue Vit D supplements  6. Hyponatremia -check BMP prior to d/c  7. Osteoporosis -unprovoked vertebral fx on prednisone -will start Fosamax 70 mg qweekly 30 min before breakfast with a full glass of water and sit upright for 30 min after  Resident has regained function and is ready to return home to [place].  She will need to follow up with her PCP regarding new dx of OP    Peggye Ley, ANP [facility] [phone]

## 2015-11-03 ENCOUNTER — Encounter: Payer: Self-pay | Admitting: Internal Medicine

## 2015-11-06 ENCOUNTER — Ambulatory Visit (HOSPITAL_COMMUNITY)
Admission: RE | Admit: 2015-11-06 | Discharge: 2015-11-06 | Disposition: A | Discharge status: Home / Self Care | Payer: Medicare Other | Source: Ambulatory Visit | Attending: Interventional Radiology | Admitting: Interventional Radiology

## 2015-11-06 DIAGNOSIS — IMO0002 Reserved for concepts with insufficient information to code with codable children: Secondary | ICD-10-CM

## 2015-11-07 ENCOUNTER — Ambulatory Visit (HOSPITAL_COMMUNITY)
Admission: RE | Admit: 2015-11-07 | Discharge: 2015-11-07 | Disposition: A | Discharge status: Home / Self Care | Payer: Medicare Other | Source: Ambulatory Visit | Attending: Interventional Radiology | Admitting: Interventional Radiology

## 2015-11-07 ENCOUNTER — Other Ambulatory Visit (HOSPITAL_COMMUNITY): Payer: Self-pay | Admitting: Interventional Radiology

## 2015-11-07 ENCOUNTER — Encounter: Payer: Self-pay | Admitting: Internal Medicine

## 2015-11-07 DIAGNOSIS — IMO0002 Reserved for concepts with insufficient information to code with codable children: Secondary | ICD-10-CM

## 2015-11-07 DIAGNOSIS — X58XXXD Exposure to other specified factors, subsequent encounter: Secondary | ICD-10-CM | POA: Insufficient documentation

## 2015-11-07 DIAGNOSIS — Z981 Arthrodesis status: Secondary | ICD-10-CM | POA: Insufficient documentation

## 2015-11-07 DIAGNOSIS — S22069D Unspecified fracture of T7-T8 vertebra, subsequent encounter for fracture with routine healing: Secondary | ICD-10-CM | POA: Insufficient documentation

## 2015-11-07 DIAGNOSIS — M4806 Spinal stenosis, lumbar region: Secondary | ICD-10-CM | POA: Diagnosis not present

## 2015-11-07 DIAGNOSIS — M5136 Other intervertebral disc degeneration, lumbar region: Secondary | ICD-10-CM | POA: Diagnosis not present

## 2015-11-07 DIAGNOSIS — T148 Other injury of unspecified body region: Secondary | ICD-10-CM | POA: Diagnosis present

## 2015-11-07 LAB — BASIC METABOLIC PANEL
BUN: 22 mg/dL — AB (ref 4–21)
CREATININE: 1.2 mg/dL — AB (ref 0.5–1.1)
Glucose: 115 mg/dL
POTASSIUM: 5.2 mmol/L (ref 3.4–5.3)
Sodium: 132 mmol/L — AB (ref 137–147)

## 2015-11-11 ENCOUNTER — Other Ambulatory Visit (HOSPITAL_COMMUNITY): Payer: Self-pay | Admitting: Interventional Radiology

## 2015-11-11 DIAGNOSIS — IMO0002 Reserved for concepts with insufficient information to code with codable children: Secondary | ICD-10-CM

## 2015-11-11 DIAGNOSIS — M549 Dorsalgia, unspecified: Secondary | ICD-10-CM

## 2015-11-12 ENCOUNTER — Other Ambulatory Visit: Payer: Self-pay | Admitting: Radiology

## 2015-11-15 ENCOUNTER — Other Ambulatory Visit (HOSPITAL_COMMUNITY): Payer: Self-pay | Admitting: Interventional Radiology

## 2015-11-15 ENCOUNTER — Ambulatory Visit (HOSPITAL_COMMUNITY)
Admission: RE | Admit: 2015-11-15 | Discharge: 2015-11-15 | Disposition: A | Discharge status: Home / Self Care | Payer: Medicare Other | Source: Ambulatory Visit | Attending: Interventional Radiology | Admitting: Interventional Radiology

## 2015-11-15 DIAGNOSIS — IMO0002 Reserved for concepts with insufficient information to code with codable children: Secondary | ICD-10-CM

## 2015-11-15 DIAGNOSIS — M549 Dorsalgia, unspecified: Secondary | ICD-10-CM

## 2015-11-15 DIAGNOSIS — M4854XA Collapsed vertebra, not elsewhere classified, thoracic region, initial encounter for fracture: Secondary | ICD-10-CM | POA: Insufficient documentation

## 2015-11-15 DIAGNOSIS — Z7952 Long term (current) use of systemic steroids: Secondary | ICD-10-CM | POA: Insufficient documentation

## 2015-11-15 DIAGNOSIS — R634 Abnormal weight loss: Secondary | ICD-10-CM | POA: Diagnosis not present

## 2015-11-15 DIAGNOSIS — Z7982 Long term (current) use of aspirin: Secondary | ICD-10-CM | POA: Insufficient documentation

## 2015-11-15 HISTORY — PX: VERTEBROPLASTY: SHX113

## 2015-11-15 LAB — BASIC METABOLIC PANEL
Anion gap: 8 (ref 5–15)
BUN: 16 mg/dL (ref 6–20)
CHLORIDE: 93 mmol/L — AB (ref 101–111)
CO2: 27 mmol/L (ref 22–32)
CREATININE: 1.03 mg/dL — AB (ref 0.44–1.00)
Calcium: 9.3 mg/dL (ref 8.9–10.3)
GFR calc Af Amer: 54 mL/min — ABNORMAL LOW (ref >60)
GFR calc non Af Amer: 47 mL/min — ABNORMAL LOW (ref >60)
Glucose, Bld: 97 mg/dL (ref 65–99)
Potassium: 3.6 mmol/L (ref 3.5–5.1)
Sodium: 128 mmol/L — ABNORMAL LOW (ref 135–145)

## 2015-11-15 LAB — CBC
HCT: 36.3 % (ref 36.0–46.0)
Hemoglobin: 12.1 g/dL (ref 12.0–15.0)
MCH: 31.4 pg (ref 26.0–34.0)
MCHC: 33.3 g/dL (ref 30.0–36.0)
MCV: 94.3 fL (ref 78.0–100.0)
PLATELETS: 262 10*3/uL (ref 150–400)
RBC: 3.85 MIL/uL — ABNORMAL LOW (ref 3.87–5.11)
RDW: 13.3 % (ref 11.5–15.5)
WBC: 10.4 10*3/uL (ref 4.0–10.5)

## 2015-11-15 LAB — PROTIME-INR
INR: 0.95 (ref 0.00–1.49)
Prothrombin Time: 12.9 seconds (ref 11.6–15.2)

## 2015-11-15 LAB — APTT: aPTT: 32 seconds (ref 24–37)

## 2015-11-15 MED ORDER — FENTANYL CITRATE (PF) 100 MCG/2ML IJ SOLN
INTRAMUSCULAR | Status: AC
  Filled 2015-11-15: qty 4

## 2015-11-15 MED ORDER — MIDAZOLAM HCL 2 MG/2ML IJ SOLN
INTRAMUSCULAR | Status: AC
  Filled 2015-11-15: qty 6

## 2015-11-15 MED ORDER — NALOXONE HCL 0.4 MG/ML IJ SOLN
INTRAMUSCULAR | Status: AC
  Filled 2015-11-15: qty 1

## 2015-11-15 MED ORDER — GELATIN ABSORBABLE 12-7 MM EX MISC
CUTANEOUS | Status: AC
  Filled 2015-11-15: qty 1

## 2015-11-15 MED ORDER — FLUMAZENIL 0.5 MG/5ML IV SOLN
INTRAVENOUS | Status: AC
  Filled 2015-11-15: qty 5

## 2015-11-15 MED ORDER — FENTANYL CITRATE (PF) 100 MCG/2ML IJ SOLN
INTRAMUSCULAR | Status: AC | PRN
  Administered 2015-11-15 (×2): 25 ug via INTRAVENOUS
  Administered 2015-11-15: 12.5 ug via INTRAVENOUS
  Administered 2015-11-15: 25 ug via INTRAVENOUS

## 2015-11-15 MED ORDER — CEFAZOLIN SODIUM-DEXTROSE 2-3 GM-% IV SOLR
2.0000 g | INTRAVENOUS | Status: AC
  Administered 2015-11-15: 2 g via INTRAVENOUS

## 2015-11-15 MED ORDER — SODIUM CHLORIDE 0.9 % IV SOLN
Freq: Once | INTRAVENOUS | Status: AC
  Administered 2015-11-15: 10:00:00 via INTRAVENOUS

## 2015-11-15 MED ORDER — HYDROMORPHONE HCL 1 MG/ML IJ SOLN
INTRAMUSCULAR | Status: AC
  Filled 2015-11-15: qty 3

## 2015-11-15 MED ORDER — FENTANYL CITRATE (PF) 100 MCG/2ML IJ SOLN: 25.0000 ug | Freq: Once | INTRAMUSCULAR | Status: DC

## 2015-11-15 MED ORDER — CEFAZOLIN SODIUM-DEXTROSE 2-3 GM-% IV SOLR
INTRAVENOUS | Status: AC
  Filled 2015-11-15: qty 50

## 2015-11-15 MED ORDER — SODIUM CHLORIDE 0.9 % IV SOLN: INTRAVENOUS | Status: AC

## 2015-11-15 MED ORDER — BUPIVACAINE HCL (PF) 0.25 % IJ SOLN
INTRAMUSCULAR | Status: AC
Start: 2015-11-15 — End: 2015-11-15
  Filled 2015-11-15: qty 30

## 2015-11-15 MED ORDER — TOBRAMYCIN SULFATE 1.2 G IJ SOLR
INTRAMUSCULAR | Status: AC
  Filled 2015-11-15: qty 1.2

## 2015-11-15 MED ORDER — MIDAZOLAM HCL 2 MG/2ML IJ SOLN
INTRAMUSCULAR | Status: AC | PRN
  Administered 2015-11-15: 0.5 mg via INTRAVENOUS
  Administered 2015-11-15 (×2): 1 mg via INTRAVENOUS

## 2015-11-15 MED ORDER — IOHEXOL 300 MG/ML  SOLN
50.0000 mL | Freq: Once | INTRAMUSCULAR | Status: AC | PRN
  Administered 2015-11-15: 5 mL via INTRAVENOUS

## 2015-11-15 NOTE — Sedation Documentation (Signed)
dsg to mid upper back intact

## 2015-11-15 NOTE — H&P (Signed)
HPI:  The patient has had a H&P performed within the last 30 days, all history, medications, and exam have been reviewed. The patient denies any interval changes since the H&P.  [name] is an 81 year old, right-handed lady who is status post T8 vertebroplasty approximately two weeks ago for severely painful compression fracture. The patient returns for follow-up post procedure.  She has significant relief of her pain in the thoracic region following the vertebroplasty. However some days later, the patient reports return of new pain which she describes at a different level. The pain apparently was preceded by no trauma or unusual activity.  This pain is in the lower thoracic region almost a 9 out of 10 in intensity on a constant basis. It varies from being hammering pain to a twisting pain.  There is no radiation of this pain into the lower extremities or upper extremities.  She denies any autonomic dysfunction of her bowel or bladder activities. Her appetite has decreased since the onset of this new pain. She maintains she has also lost some weight in the last couple of weeks.  She takes an occasional ibuprofen for her pain with modest relief.  She continues to be ambulatory with a walker. However she lives by herself in a retired [employer].  She at the moment does not drive.   Medications: Prior to Admission medications   Medication Sig Start Date End Date Taking? Authorizing Provider  acetaminophen (TYLENOL) 650 MG CR tablet Take 650 mg by mouth every 6 (six) hours as needed for pain.   Yes Historical Provider, MD  amLODipine (NORVASC) 10 MG tablet Take 1 tablet (10 mg total) by mouth daily. 07/21/13  Yes Nishant Dhungel, MD  aspirin EC 81 MG tablet Take 81 mg by mouth daily.   Yes Historical Provider, MD  beta carotene w/minerals (OCUVITE) tablet Take 1 tablet by mouth daily.   Yes Historical Provider, MD  calcium carbonate (CALCIUM 600) 600 MG TABS tablet Take 600 mg by  mouth 2 (two) times daily with a meal.   Yes Historical Provider, MD  ergocalciferol (VITAMIN D2) 50000 UNITS capsule Take 50,000 Units by mouth once a week. On saturday   Yes Historical Provider, MD  gabapentin (NEURONTIN) 300 MG capsule Take 300 mg by mouth 2 (two) times daily.    Yes Nadara Mustard, MD  hydrochlorothiazide (MICROZIDE) 12.5 MG capsule Take 12.5 mg by mouth daily.   Yes Historical Provider, MD  olmesartan (BENICAR) 20 MG tablet Take 10 mg by mouth daily.    Yes Historical Provider, MD  predniSONE (DELTASONE) 20 MG tablet Take 20 mg by mouth daily with breakfast.   Yes Historical Provider, MD  senna (SENOKOT) 8.6 MG TABS tablet Take 3 tablets by mouth at bedtime.    Yes Historical Provider, MD     Vital Signs: BP 159/67 mmHg  Pulse 76  Temp(Src) 97.5 F (36.4 C) (Oral)  Resp 16  Ht  (1.6 m)  Wt 135 lb (61.236 kg)  BMI 23.92 kg/m2  SpO2 98%  Physical Exam  Constitutional: She is oriented to person, place, and time. She appears well-developed and well-nourished.  HENT:  Head: Normocephalic and atraumatic.  Eyes: EOM are normal.  Neck: Normal range of motion. Neck supple.  Cardiovascular: Normal rate, regular rhythm and normal heart sounds.   No murmur heard. Pulmonary/Chest: Effort normal and breath sounds normal. No respiratory distress. She has no wheezes.  Abdominal: Soft. Bowel sounds are normal. She exhibits no distension. There is  no tenderness.  Musculoskeletal: Normal range of motion.  Significant tenderness at the mid to lower thoracic region  Neurological: She is alert and oriented to person, place, and time.  Skin: Skin is warm and dry.  Psychiatric: She has a normal mood and affect. Her behavior is normal. Judgment and thought content normal.  Vitals reviewed.   Mallampati Score:  MD Evaluation Airway: WNL Heart: WNL Abdomen: WNL Chest/ Lungs: WNL ASA  Classification: 3 Mallampati/Airway Score: Two  Labs:  CBC:  Recent Labs  01/10/15  01/17/15 10/22/15 1109 11/15/15 0921  WBC 5.1 5.7 8.1 10.4  HGB 7.5* 8.4* 11.8* 12.1  HCT 23* 26* 34.0* 36.3  PLT 435* 357 198 262    COAGS:  Recent Labs  12/24/14 1512 10/22/15 1109  INR 0.97 1.05  APTT 30 33    BMP:  Recent Labs  01/03/15 0413 01/04/15 0520 01/10/15 10/22/15 1109 11/15/15 0921  NA 126* 125* 130* 128* 128*  K 4.1 3.9 4.5 3.4* 3.6  CL 87* 93*  --  86* 93*  CO2 26 28  --  30 27  GLUCOSE 111* 121*  --  120* 97  BUN 22 16 25* 12 16  CALCIUM 8.0* 7.7*  --  9.3 9.3  CREATININE 1.28* 1.00 1.1 0.91 1.03*  GFRNONAA 36* 49*  --  54* 47*  GFRAA 42* 57*  --  >60 54*    LIVER FUNCTION TESTS: No results for input(s): BILITOT, AST, ALT, ALKPHOS, PROT, ALBUMIN in the last 8760 hours.  Assessment/Plan:   T-7 compression fracture.  Will proceed with T-7 Kyphoplasty/vertebroplasty today by Dr. Corliss Skainseveshwar  Risks and Benefits discussed with the patient including, but not limited to education regarding the natural healing process of compression fractures without intervention, bleeding, infection, cement migration which may cause spinal cord damage, paralysis, pulmonary embolism or even death.  All of the patient's questions were answered, patient is agreeable to proceed. Consent signed and in chart.   Signed: Gwynneth MacleodWENDY S BLAIR PA-C 11/15/2015, 9:50 AM

## 2015-11-15 NOTE — Procedures (Signed)
S/P T 7 VP 

## 2015-11-15 NOTE — Discharge Instructions (Signed)
1.No stooping,bending or lifting more than 10 lbs for 2 weeks. 2.Use walker to ambulate for 2 weeks  3.RTC in 2 weeks KYPHOPLASTY/VERTEBROPLASTY DISCHARGE INSTRUCTIONS  Medications: (check all that apply)     Resume all home medications as before procedure.       Resume your (aspirin/Plavix/Coumadin)                Continue your pain medications as prescribed as needed.  Over the next 3-5 days, decrease your pain medication as tolerated.  Over the counter medications (i.e. Tylenol, ibuprofen, and aleve) may be substituted once severe/moderate pain symptoms have subsided.   Wound Care: - Bandages may be removed the day following your procedure.  You may get your incision wet once bandages are removed.  Bandaids may be used to cover the incisions until scab formation.  Topical ointments are optional.  - If you develop a fever greater than 101 degrees, have increased skin redness at the incision sites or pus-like oozing from incisions occurring within 1 week of the procedure, contact radiology at [phone] or [phone].  - Ice pack to back for 15-20 minutes 2-3 time per day for first 2-3 days post procedure.  The ice will expedite muscle healing and help with the pain from the incisions.   Activity: - Bedrest today with limited activity for 24 hours post procedure.  - No driving for 48 hours.  - Increase your activity as tolerated after bedrest (with assistance if necessary).  - Refrain from any strenuous activity or heavy lifting (greater than 10 lbs.).   Follow up: - Contact radiology at [phone] or [phone] if any questions/concerns.  - A physician assistant from radiology will contact you in approximately 1 week.  - If a biopsy was performed at the time of your procedure, your referring physician should receive the results in usually 2-3 days.

## 2015-11-19 ENCOUNTER — Telehealth (HOSPITAL_COMMUNITY): Payer: Self-pay

## 2015-11-19 ENCOUNTER — Encounter: Payer: Self-pay | Admitting: Internal Medicine

## 2015-11-19 ENCOUNTER — Other Ambulatory Visit (HOSPITAL_COMMUNITY): Payer: Self-pay | Admitting: Interventional Radiology

## 2015-11-19 ENCOUNTER — Non-Acute Institutional Stay (SKILLED_NURSING_FACILITY): Payer: Medicare Other | Admitting: Internal Medicine

## 2015-11-19 DIAGNOSIS — N183 Chronic kidney disease, stage 3 unspecified: Secondary | ICD-10-CM

## 2015-11-19 DIAGNOSIS — I10 Essential (primary) hypertension: Secondary | ICD-10-CM

## 2015-11-19 DIAGNOSIS — F3341 Major depressive disorder, recurrent, in partial remission: Secondary | ICD-10-CM

## 2015-11-19 DIAGNOSIS — IMO0002 Reserved for concepts with insufficient information to code with codable children: Secondary | ICD-10-CM

## 2015-11-19 DIAGNOSIS — E871 Hypo-osmolality and hyponatremia: Secondary | ICD-10-CM | POA: Diagnosis not present

## 2015-11-19 DIAGNOSIS — M810 Age-related osteoporosis without current pathological fracture: Secondary | ICD-10-CM

## 2015-11-19 DIAGNOSIS — Z9889 Other specified postprocedural states: Secondary | ICD-10-CM

## 2015-11-19 DIAGNOSIS — K5903 Drug induced constipation: Secondary | ICD-10-CM

## 2015-11-19 DIAGNOSIS — S22060D Wedge compression fracture of T7-T8 vertebra, subsequent encounter for fracture with routine healing: Secondary | ICD-10-CM | POA: Diagnosis not present

## 2015-11-19 NOTE — Progress Notes (Signed)
Patient ID: [name], female   DOB: 12-14-1924, 81 y.o.   MRN: [id number]  Provider:  Gwenith Spitz. Renato Gails, D.O., C.M.D. Location:  [facility] PCP: Hoyle Sauer, MD  Code Status: DNR Goals of Care: Advanced Directive information Does patient have an advance directive?: Yes, Type of Advance Directive: Healthcare Power of [place];Living will;Out of facility DNR (pink MOST or yellow form), Pre-existing out of facility DNR order (yellow form or pink MOST form): Yellow form placed in chart (order not valid for inpatient use)   Chief Complaint  Patient presents with  . New Admit To SNF    Rehab following kyphoplasty T8 11/15/15    HPI: Patient is a 81 y.o. female seen today for admission to rehab s/p kyphoplasty of T7 on 11/15/15 by Dr. Link Snuffer.  For her pain, she is taking cymbalta, robaxin and tramadol and I added fentanyl 12.51mcg when she first arrived b/c she was in excruciating pain not relieved by the other agents.  She has benefited from it in the past.  She feels a lot better than the last time she was here for rehab after the previous kyphoplasty.  She's been up doing PT and OT.  She is using her walker.  She is very limited in her flexibility at this point.  She is weak per staff.  She does not admit to this.    Reviewed Dr. Vicente Males notes.  He plans to begin her on a reclast infusion in place of fosamax due to her history of GERD.  She has tolerated the fosamax here thus far pending that infusion.  She remains on tums, vitamin D for her osteoporosis.    When she left here last time, she was off of hctz due to hyponatremia.  She had received just one week of meloxicam due to severe pain and did get enough benefit from that so she could participate in therapy.  His notes indicate she was taking large doses of ibuprofen at home on her own accord (not in our discharge meds).  This had affected her renal function.    Review of Systems  Constitutional: Positive for malaise/fatigue.  Negative for fever and chills.  Eyes: Negative for blurred vision.  Respiratory: Negative for shortness of breath.   Cardiovascular: Negative for chest pain and leg swelling.  Gastrointestinal: Negative for abdominal pain, constipation, blood in stool and melena.  Genitourinary: Negative for dysuria.  Musculoskeletal: Positive for back pain. Negative for falls.  Skin: Negative for rash.  Neurological: Positive for weakness. Negative for dizziness, loss of consciousness and headaches.  Psychiatric/Behavioral: Negative for depression and memory loss.    Past Medical History  Diagnosis Date  . Arthritis   . Osteoporosis   . Essential hypertension, benign   . Osteoarthrosis, unspecified whether generalized or localized, unspecified site   . Other malaise and fatigue     re: stress, anxiety  . Allergic rhinitis, cause unspecified   . Osteoporosis/osteopenia increased risk   . Macular degeneration   . Venous insufficiency   . Cataract cortical, senile 2010    s/p bil extract/IOL implants   . Degenerative disk disease 06/2013  . Spinal stenosis of lumbar region 07/08/2013  . Hyponatremia 07/19/2013  . Right foot drop 08/08/2013  . Mitral valve prolapse   . GERD (gastroesophageal reflux disease)   . Constipation   . Pneumonia 1920's   Past Surgical History  Procedure Laterality Date  . Total hip arthroplasty Left 2005  . Replacement total knee Right 2010  . Tonsillectomy    .  Colonoscopy    . Total hip arthroplasty Right 01/02/2015  . Joint replacement    . Cataract extraction w/ intraocular lens  implant, bilateral Bilateral   . Total hip arthroplasty Right 01/02/2015    Procedure: TOTAL HIP ARTHROPLASTY;  Surgeon: Nadara Mustard, MD;  Location: [facility] OR;  Service: Orthopedics;  Laterality: Right;  . Vertebroplasty  11/15/15    X2    reports that she quit smoking about 52 years ago. Her smoking use included Cigarettes. She has a 10 pack-year smoking history. She has never used  smokeless tobacco. She reports that she drinks about 8.4 oz of alcohol per week. She reports that she does not use illicit drugs. Family History  Problem Relation Age of Onset  . Esophageal cancer Mother 68  . Breast cancer Daughter   . Thyroid disease Daughter     No Known Allergies    Medication List       This list is accurate as of: 11/19/15 11:59 PM.  Always use your most recent med list.               acetaminophen 650 MG CR tablet  Commonly known as:  TYLENOL  Take 650 mg by mouth every 6 (six) hours as needed for pain.     alendronate 70 MG tablet  Commonly known as:  FOSAMAX  Take 70 mg by mouth. Take with a full glass of water on an empty stomach. Once a day on Saturday     amLODipine 10 MG tablet  Commonly known as:  NORVASC  Take 1 tablet (10 mg total) by mouth daily.     aspirin EC 81 MG tablet  Take 81 mg by mouth daily.     beta carotene w/minerals tablet  Take 1 tablet by mouth daily.     CALCIUM 600 600 MG Tabs tablet  Generic drug:  calcium carbonate  Take 600 mg by mouth 2 (two) times daily with a meal.     DULoxetine 60 MG capsule  Commonly known as:  CYMBALTA  Take 60 mg by mouth. Take one capsule in the morning     ergocalciferol 50000 UNITS capsule  Commonly known as:  VITAMIN D2  Take 50,000 Units by mouth once a week. On saturday     fentaNYL 12 MCG/HR  Commonly known as:  DURAGESIC - dosed mcg/hr  Place 12.5 mcg onto the skin every 3 (three) days.     gabapentin 300 MG capsule  Commonly known as:  NEURONTIN  Take 300 mg by mouth 2 (two) times daily.     Melatonin 3 MG Tabs  Take by mouth. Take one tablet every evening as needed for sleep     methocarbamol 500 MG tablet  Commonly known as:  ROBAXIN  Take 500 mg by mouth. Take on tablet every 8 hours as need for muscle spasms     olmesartan 20 MG tablet  Commonly known as:  BENICAR  Take 10 mg by mouth daily.     polyethylene glycol packet  Commonly known as:  MIRALAX /  GLYCOLAX  Take 17 g by mouth daily.     predniSONE 20 MG tablet  Commonly known as:  DELTASONE  Take 20 mg by mouth daily with breakfast.     senna 8.6 MG Tabs tablet  Commonly known as:  SENOKOT  Take 3 tablets by mouth at bedtime.     traMADol 50 MG tablet  Commonly known as:  ULTRAM  Take by  mouth. Take one every 6 hours as needed for pain 1-5     TUMS KIDS 750 MG chewable tablet  Generic drug:  calcium carbonate  Chew 1 tablet by mouth. Take twice daily as needed        Physical Exam: Filed Vitals:   11/19/15 1333  BP: 113/67  Pulse: 60  Temp: 96.6 F (35.9 C)  Resp: 13  Height: 5\' 3"  (1.6 m)  Weight: 134 lb (60.782 kg)  SpO2: 92%   Body mass index is 23.74 kg/(m^2). Physical Exam  Constitutional: She is oriented to person, place, and time. She appears well-developed and well-nourished. No distress.  HENT:  Head: Normocephalic and atraumatic.  Right Ear: External ear normal.  Left Ear: External ear normal.  Nose: Nose normal.  Mouth/Throat: Oropharynx is clear and moist.  Eyes: Conjunctivae and EOM are normal. Pupils are equal, round, and reactive to light.  Neck: Normal range of motion. Neck supple. No JVD present.  Cardiovascular: Normal rate, regular rhythm, normal heart sounds and intact distal pulses.   Pulmonary/Chest: Effort normal and breath sounds normal.  Abdominal: Soft. Bowel sounds are normal. She exhibits no distension and no mass. There is no tenderness.  Musculoskeletal: She exhibits tenderness.  Over thoracic spine and lower lumbar spine while seated in her chair  Lymphadenopathy:    She has no cervical adenopathy.  Neurological: She is alert and oriented to person, place, and time.  Skin: Skin is warm and dry.  Psychiatric: She has a normal mood and affect. Her behavior is normal. Thought content normal.    Labs reviewed: Basic Metabolic Panel:  Recent Labs  81/19/14 0520 01/10/15 10/22/15 1109 11/15/15 0921  NA 125* 130* 128*  128*  K 3.9 4.5 3.4* 3.6  CL 93*  --  86* 93*  CO2 28  --  30 27  GLUCOSE 121*  --  120* 97  BUN 16 25* 12 16  CREATININE 1.00 1.1 0.91 1.03*  CALCIUM 7.7*  --  9.3 9.3   Liver Function Tests: No results for input(s): AST, ALT, ALKPHOS, BILITOT, PROT, ALBUMIN in the last 8760 hours. No results for input(s): LIPASE, AMYLASE in the last 8760 hours. No results for input(s): AMMONIA in the last 8760 hours. CBC:  Recent Labs  01/04/15 0520  01/17/15 10/22/15 1109 11/15/15 0921  WBC 6.1  < > 5.7 8.1 10.4  HGB 7.2*  < > 8.4* 11.8* 12.1  HCT 21.1*  < > 26* 34.0* 36.3  MCV 90.6  --   --  93.9 94.3  PLT 228  < > 357 198 262  < > = values in this interval not displayed. Cardiac Enzymes: No results for input(s): CKTOTAL, CKMB, CKMBINDEX, TROPONINI in the last 8760 hours. BNP: Invalid input(s): POCBNP No results found for: HGBA1C Lab Results  Component Value Date   TSH 2.047 07/20/2013   Imaging and Procedures obtained prior to SNF admission: 10/16/15 MRI thoracic spine w/o contrast:  Acute - subacute T8 vertebral body compression fracture with mild marrow edema throughout the vertebral body and greater than 90% anterior height loss. Mild retropulsion of the superior posterior margin of the T8 vertebral body resulting in mild spinal stenosis  10/22/15:  Status post vertebral body augmentation for painful compression fracture at T8 using vertebroplasty technique.  11/06/15:  Radiologist Eval and Mgt:  The patient's previous MRI scan of the thoracic spine was reviewed with the patient. Given the patient's above clinical history and findings, it was felt patient may  have a new fracture that could be responsible for her new pain after having had relief from the procedure. This was explained to the patient and her daughter. They're in agreement to proceed with an MRI of the thoracic spine to the level of L2. Depending on the MRI examination, further recommendations to follow.  11/07/15:   MRI thoracic and lumbar spine w/o contrast:   MR THORACIC SPINE IMPRESSION 1. Severe T8 compression fracture status post augmentation with essentially complete vertebral body height loss. Retropulsion has slightly increased and results in moderate spinal stenosis. 2. Acute T7 inferior endplate compression fracture with 60% height loss. No retropulsion. 3. New, mild edema in the right posterior eighth rib without a displaced fracture identified. MR LUMBAR SPINE IMPRESSION 1. No lumbar compression fracture. 2. Multilevel lumbar disc and facet degeneration, worst at L3-4 where there is severe spinal stenosis. 3. Prior sacroplasty.  11/15/15:  Status post vertebral body augmentation for painful compression fracture at T7 using vertebroplasty technique.  Assessment/Plan 1. Traumatic compression fracture of T7 thoracic vertebra, with routine healing, subsequent encounter -making some progress -pain definitely better controlled than after last kyphoplasty -cont fentanyl 12.43mcg and prn tramadol and tylenol for pain -avoid nsaid use due to ckd3 and gerd  2. S/P kyphoplasty -11/25 by Dr. Corliss Skains -making progress -cont PT, OT until she is truly safe to return home--she agrees to stay in rehab longer to make sure she is ready this time  3. Essential hypertension, benign -bp at goal with norvasc, benicar, off hctz due to hyponatremia -cont to monitor to ensure she is not low when her pain is better  4. Constipation due to pain medication -continues with senna 3 daily and miralax at bedtime -bowels moving with this regimen  5. Recurrent major depressive disorder, in partial remission (HCC) -continues on her cymbalta 60mg  daily--spirits are good  6. Hyponatremia -f/u bmp due to low sodium and acute kidney injury from nsaids per PCP notes  7. Chronic kidney disease, stage 3, mod decreased GFR -f/u bmp -avoid nsaids  8. Senile osteoporosis -cont on fosamax until ready for reclast  infusion -cont vitamin D weekly and tums for calcium  Functional status:  Currently requires minimal assistance with bathing, dressing due to pain and decreased mobility  Family/ staff Communication: discussed with rehab nurse, PT, OT and nurse manager  Labs/tests ordered:  Bmp next draw  Nguyen Todorov L. Omni Dunsworth, D.O. Geriatrics [facility] [address], [place] [address] Cell Phone (Mon-Fri 8am-5pm):  [phone] On Call:  [phone] & follow prompts after 5pm & weekends Office Phone:  [phone] Office Fax:  [phone]

## 2015-11-19 NOTE — Telephone Encounter (Signed)
Called to schedule f/u appt, left message for pt to call back. AW

## 2015-11-28 LAB — BASIC METABOLIC PANEL
BUN: 16 mg/dL (ref 4–21)
Creatinine: 0.7 mg/dL (ref 0.5–1.1)
Glucose: 87 mg/dL
POTASSIUM: 3.8 mmol/L (ref 3.4–5.3)
Sodium: 137 mmol/L (ref 137–147)

## 2015-11-29 ENCOUNTER — Other Ambulatory Visit (HOSPITAL_COMMUNITY): Payer: Self-pay | Admitting: Internal Medicine

## 2015-11-29 ENCOUNTER — Encounter (HOSPITAL_COMMUNITY): Payer: Self-pay

## 2015-11-29 ENCOUNTER — Ambulatory Visit (HOSPITAL_COMMUNITY)
Admission: RE | Admit: 2015-11-29 | Discharge: 2015-11-29 | Disposition: A | Discharge status: Home / Self Care | Payer: Medicare Other | Source: Ambulatory Visit | Attending: Internal Medicine | Admitting: Internal Medicine

## 2015-11-29 DIAGNOSIS — M810 Age-related osteoporosis without current pathological fracture: Secondary | ICD-10-CM | POA: Insufficient documentation

## 2015-11-29 MED ORDER — ZOLEDRONIC ACID 5 MG/100ML IV SOLN
5.0000 mg | Freq: Once | INTRAVENOUS | Status: AC
  Administered 2015-11-29: 5 mg via INTRAVENOUS
  Filled 2015-11-29: qty 100

## 2015-11-29 MED ORDER — SODIUM CHLORIDE 0.9 % IV SOLN
250.0000 mL | INTRAVENOUS | Status: AC
  Administered 2015-11-29: 250 mL via INTRAVENOUS

## 2015-11-29 NOTE — Discharge Instructions (Signed)
Zoledronic Acid injection (Paget's Disease, Osteoporosis) °What is this medicine? °ZOLEDRONIC ACID (ZOE le dron ik AS id) lowers the amount of calcium loss from bone. It is used to treat Paget's disease and osteoporosis in women. °This medicine may be used for other purposes; ask your health care provider or pharmacist if you have questions. °What should I tell my health care provider before I take this medicine? °They need to know if you have any of these conditions: °-aspirin-sensitive asthma °-cancer, especially if you are receiving medicines used to treat cancer °-dental disease or wear dentures °-infection °-kidney disease °-low levels of calcium in the blood °-past surgery on the parathyroid gland or intestines °-receiving corticosteroids like dexamethasone or prednisone °-an unusual or allergic reaction to zoledronic acid, other medicines, foods, dyes, or preservatives °-pregnant or trying to get pregnant °-breast-feeding °How should I use this medicine? °This medicine is for infusion into a vein. It is given by a health care professional in a hospital or clinic setting. °Talk to your pediatrician regarding the use of this medicine in children. This medicine is not approved for use in children. °Overdosage: If you think you have taken too much of this medicine contact a poison control center or emergency room at once. °NOTE: This medicine is only for you. Do not share this medicine with others. °What if I miss a dose? °It is important not to miss your dose. Call your doctor or health care professional if you are unable to keep an appointment. °What may interact with this medicine? °-certain antibiotics given by injection °-NSAIDs, medicines for pain and inflammation, like ibuprofen or naproxen °-some diuretics like bumetanide, furosemide °-teriparatide °This list may not describe all possible interactions. Give your health care provider a list of all the medicines, herbs, non-prescription drugs, or dietary  supplements you use. Also tell them if you smoke, drink alcohol, or use illegal drugs. Some items may interact with your medicine. °What should I watch for while using this medicine? °Visit your doctor or health care professional for regular checkups. It may be some time before you see the benefit from this medicine. Do not stop taking your medicine unless your doctor tells you to. Your doctor may order blood tests or other tests to see how you are doing. °Women should inform their doctor if they wish to become pregnant or think they might be pregnant. There is a potential for serious side effects to an unborn child. Talk to your health care professional or pharmacist for more information. °You should make sure that you get enough calcium and vitamin D while you are taking this medicine. Discuss the foods you eat and the vitamins you take with your health care professional. °Some people who take this medicine have severe bone, joint, and/or muscle pain. This medicine may also increase your risk for jaw problems or a broken thigh bone. Tell your doctor right away if you have severe pain in your jaw, bones, joints, or muscles. Tell your doctor if you have any pain that does not go away or that gets worse. °Tell your dentist and dental surgeon that you are taking this medicine. You should not have major dental surgery while on this medicine. See your dentist to have a dental exam and fix any dental problems before starting this medicine. Take good care of your teeth while on this medicine. Make sure you see your dentist for regular follow-up appointments. °What side effects may I notice from receiving this medicine? °Side effects that you should   report to your doctor or health care professional as soon as possible: -allergic reactions like skin rash, itching or hives, swelling of the face, lips, or tongue -anxiety, confusion, or depression -breathing problems -changes in vision -eye pain -feeling faint or  lightheaded, falls -jaw pain, especially after dental work -mouth sores -muscle cramps, stiffness, or weakness -redness, blistering, peeling or loosening of the skin, including inside the mouth -trouble passing urine or change in the amount of urine Side effects that usually do not require medical attention (report to your doctor or health care professional if they continue or are bothersome): -bone, joint, or muscle pain -constipation -diarrhea -fever -hair loss -irritation at site where injected -loss of appetite -nausea, vomiting -stomach upset -trouble sleeping -trouble swallowing -weak or tired This list may not describe all possible side effects. Call your doctor for medical advice about side effects. You may report side effects to FDA at 1-800-FDA-1088. Where should I keep my medicine? This drug is given in a hospital or clinic and will not be stored at home. NOTE: This sheet is a summary. It may not cover all possible information. If you have questions about this medicine, talk to your doctor, pharmacist, or health care provider.    2016, Elsevier/Gold Standard. (2014-05-05 14:19:57)                      Continue calcium and Vitamin D

## 2015-12-03 ENCOUNTER — Encounter: Payer: Self-pay | Admitting: *Deleted

## 2015-12-04 ENCOUNTER — Ambulatory Visit (HOSPITAL_COMMUNITY): Admission: RE | Admit: 2015-12-04 | Payer: Medicare Other | Source: Ambulatory Visit

## 2015-12-05 ENCOUNTER — Non-Acute Institutional Stay (SKILLED_NURSING_FACILITY): Payer: Medicare Other | Admitting: Adult Health

## 2015-12-05 DIAGNOSIS — R413 Other amnesia: Secondary | ICD-10-CM

## 2015-12-05 DIAGNOSIS — S22000D Wedge compression fracture of unspecified thoracic vertebra, subsequent encounter for fracture with routine healing: Secondary | ICD-10-CM | POA: Diagnosis not present

## 2015-12-12 ENCOUNTER — Encounter: Payer: Self-pay | Admitting: Adult Health

## 2015-12-12 DIAGNOSIS — R413 Other amnesia: Secondary | ICD-10-CM | POA: Insufficient documentation

## 2015-12-12 NOTE — Progress Notes (Signed)
Patient ID: [name], female   DOB: 11/08/1924, 81 y.o.   MRN: [id number]     Nursing Home Location:  [facility]    Patient Care Team: Chilton Greathouse, MD as PCP - General (Internal Medicine) [facility] Nadara Mustard, MD as Consulting Physician (Orthopedic Surgery)   Place of Service: SNF (31)  Chief Complaint  Patient presents with  . Acute Visit    memory issues    HPI:  81 y.o.female  residing at [name], skilled rehab section. I am here to evaluate her for issues regarding her memory noted by her family and the staff since her admission.  She has had two compression fractures on T 8 on 10/29 s/p kyphoplasty, T7 on 11/25 s/p kyphoplasty.  She is here for physical therapy. She reports that she has noticed that her memory has been getting worse for quite sometime. Her daughter reports that it has been worse in rehab but better today, as compared to yesterday.  She has not had any focal deficit, and denies any SOB, cough, or dysuria. She is using ultram for pain, as she was having increased confusion and slurred speech with Fentanyl.  She is able to feed herself and perform ADL's.   Staff reports she has issues with incontinence at times and is having trouble understanding the different products available.      Review of Systems:  Review of Systems  Constitutional: Negative for fever, chills, diaphoresis, appetite change, fatigue and unexpected weight change.  Respiratory: Negative for cough and shortness of breath.   Cardiovascular: Negative for chest pain and leg swelling.  Gastrointestinal: Negative for diarrhea, constipation and abdominal distention.  Genitourinary: Negative for dysuria and difficulty urinating.  Musculoskeletal: Positive for back pain, arthralgias and gait problem.  Neurological: Negative for dizziness, syncope, facial asymmetry, speech difficulty, weakness, light-headedness and numbness.    Psychiatric/Behavioral: Negative for behavioral problems and agitation.       Memory loss    Medications: Patient's Medications  New Prescriptions   No medications on file  Previous Medications   ACETAMINOPHEN (TYLENOL) 650 MG CR TABLET    Take 650 mg by mouth every 6 (six) hours as needed for pain.   AMLODIPINE (NORVASC) 10 MG TABLET    Take 1 tablet (10 mg total) by mouth daily.   ASPIRIN EC 81 MG TABLET    Take 81 mg by mouth daily.   BETA CAROTENE W/MINERALS (OCUVITE) TABLET    Take 1 tablet by mouth daily.   CALCIUM CARBONATE (CALCIUM 600) 600 MG TABS TABLET    Take 600 mg by mouth 2 (two) times daily with a meal.   CALCIUM CARBONATE (TUMS KIDS) 750 MG CHEWABLE TABLET    Chew 1 tablet by mouth. Take twice daily as needed   DULOXETINE (CYMBALTA) 60 MG CAPSULE    Take 60 mg by mouth. Take one capsule in the morning   ERGOCALCIFEROL (VITAMIN D2) 50000 UNITS CAPSULE    Take 50,000 Units by mouth once a week. On saturday   GABAPENTIN (NEURONTIN) 300 MG CAPSULE    Take 300 mg by mouth 2 (two) times daily.   MELATONIN 3 MG TABS    Take by mouth. Take one tablet every evening as needed for sleep   METHOCARBAMOL (ROBAXIN) 500 MG TABLET    Take 500 mg by mouth. Take on tablet every 8 hours as need for muscle spasms   OLMESARTAN (BENICAR) 20 MG TABLET    Take 10  mg by mouth daily.    POLYETHYLENE GLYCOL (MIRALAX / GLYCOLAX) PACKET    Take 17 g by mouth daily.   SENNA (SENOKOT) 8.6 MG TABS TABLET    Take 3 tablets by mouth at bedtime as needed (for constipation).    TRAMADOL (ULTRAM) 50 MG TABLET    Take by mouth. Take one every 6 hours as needed for pain 1-5, 2 for 6-10   ZOLEDRONIC ACID (RECLAST) 5 MG/100ML SOLN INJECTION    Inject 5 mg into the vein once.  Modified Medications   No medications on file  Discontinued Medications   ALENDRONATE (FOSAMAX) 70 MG TABLET    Take 70 mg by mouth. Take with a full glass of water on an empty stomach. Once a day on Saturday   FENTANYL (DURAGESIC -  DOSED MCG/HR) 12 MCG/HR    Place 12.5 mcg onto the skin every 3 (three) days.     Physical Exam:  VS reviewed Physical Exam  Constitutional: She is oriented to person, place, and time. No distress.  HENT:  Head: Normocephalic and atraumatic.  Eyes: Pupils are equal, round, and reactive to light.  Neck: No JVD present.  Cardiovascular: Normal rate and regular rhythm.   No murmur heard. Pulmonary/Chest: Effort normal and breath sounds normal.  Abdominal: Soft. Bowel sounds are normal. She exhibits no distension.  Musculoskeletal: She exhibits no edema or tenderness.  Neurological: She is alert and oriented to person, place, and time. No cranial nerve deficit.  Skin: Skin is warm and dry. She is not diaphoretic.  Psychiatric: Affect normal.    Wt Readings from Last 3 Encounters:  11/19/15 134 lb (60.782 kg)  11/15/15 135 lb (61.236 kg)  11/07/15 135 lb (61.236 kg)     Labs reviewed/Significant Diagnostic Results:  Basic Metabolic Panel:  Recent Labs  16/09/9600/15/16 0520  10/22/15 1109 11/07/15 11/15/15 0921 11/28/15  NA 125*  < > 128* 132* 128* 137  K 3.9  < > 3.4* 5.2 3.6 3.8  CL 93*  --  86*  --  93*  --   CO2 28  --  30  --  27  --   GLUCOSE 121*  --  120*  --  97  --   BUN 16  < > 12 22* 16 16  CREATININE 1.00  < > 0.91 1.2* 1.03* 0.7  CALCIUM 7.7*  --  9.3  --  9.3  --   < > = values in this interval not displayed. Liver Function Tests: No results for input(s): AST, ALT, ALKPHOS, BILITOT, PROT, ALBUMIN in the last 8760 hours. No results for input(s): LIPASE, AMYLASE in the last 8760 hours. No results for input(s): AMMONIA in the last 8760 hours. CBC:  Recent Labs  01/04/15 0520  01/17/15 10/22/15 1109 11/15/15 0921  WBC 6.1  < > 5.7 8.1 10.4  HGB 7.2*  < > 8.4* 11.8* 12.1  HCT 21.1*  < > 26* 34.0* 36.3  MCV 90.6  --   --  93.9 94.3  PLT 228  < > 357 198 262  < > = values in this interval not displayed. CBG: No results for input(s): GLUCAP in the last 8760  hours. TSH: No results for input(s): TSH in the last 8760 hours. A1C: No results found for: HGBA1C Lipid Panel: No results for input(s): CHOL, HDL, LDLCALC, TRIG, CHOLHDL, LDLDIRECT in the last 8760 hours.     Assessment/Plan  1) Memory loss -multifactorial, currently she is on ultram for pain,  and in a new environment -I have asked the staff perform an MMSE and report back to me -Resident remains independent for ADLs but has subtle signs of memory loss -will need need someone to over see her meds at discharge (fill her pill box)  2) Thoracic compression fracture -s/p kyphoplasty x 2 -continue ultram for pain -avoid nsaids -continue physical therapy, improvement noted   Peggye Ley, ANP [facility] [phone]

## 2015-12-19 ENCOUNTER — Encounter (HOSPITAL_COMMUNITY): Payer: Self-pay | Admitting: Emergency Medicine

## 2015-12-19 ENCOUNTER — Emergency Department (HOSPITAL_COMMUNITY)
Admission: EM | Admit: 2015-12-19 | Discharge: 2015-12-19 | Discharge status: Against Medical Advice | Payer: Medicare Other | Attending: Emergency Medicine | Admitting: Emergency Medicine

## 2015-12-19 DIAGNOSIS — I10 Essential (primary) hypertension: Secondary | ICD-10-CM | POA: Diagnosis not present

## 2015-12-19 DIAGNOSIS — M549 Dorsalgia, unspecified: Secondary | ICD-10-CM | POA: Insufficient documentation

## 2015-12-19 NOTE — ED Notes (Signed)
Called pt x 2

## 2015-12-19 NOTE — ED Notes (Signed)
Pt c/o back pain x several months, hx of compression fracture to spine. Denies any new bowel or bladder incontinence. Ambulatory. No new symptoms today.

## 2015-12-23 ENCOUNTER — Inpatient Hospital Stay (HOSPITAL_COMMUNITY)
Admission: EM | Admit: 2015-12-23 | Discharge: 2015-12-27 | DRG: 481 | Disposition: A | Discharge status: Skilled Nursing Facility | Payer: Medicare Other | Attending: Orthopedic Surgery | Admitting: Orthopedic Surgery

## 2015-12-23 ENCOUNTER — Emergency Department (HOSPITAL_COMMUNITY): Payer: Medicare Other

## 2015-12-23 DIAGNOSIS — Z87891 Personal history of nicotine dependence: Secondary | ICD-10-CM | POA: Diagnosis not present

## 2015-12-23 DIAGNOSIS — I10 Essential (primary) hypertension: Secondary | ICD-10-CM | POA: Diagnosis present

## 2015-12-23 DIAGNOSIS — Z96643 Presence of artificial hip joint, bilateral: Secondary | ICD-10-CM | POA: Diagnosis present

## 2015-12-23 DIAGNOSIS — Z419 Encounter for procedure for purposes other than remedying health state, unspecified: Secondary | ICD-10-CM

## 2015-12-23 DIAGNOSIS — W19XXXA Unspecified fall, initial encounter: Secondary | ICD-10-CM | POA: Diagnosis present

## 2015-12-23 DIAGNOSIS — H3530 Unspecified macular degeneration: Secondary | ICD-10-CM | POA: Diagnosis present

## 2015-12-23 DIAGNOSIS — I739 Peripheral vascular disease, unspecified: Secondary | ICD-10-CM | POA: Diagnosis present

## 2015-12-23 DIAGNOSIS — Z7982 Long term (current) use of aspirin: Secondary | ICD-10-CM | POA: Diagnosis not present

## 2015-12-23 DIAGNOSIS — M80051A Age-related osteoporosis with current pathological fracture, right femur, initial encounter for fracture: Secondary | ICD-10-CM | POA: Diagnosis present

## 2015-12-23 DIAGNOSIS — D62 Acute posthemorrhagic anemia: Secondary | ICD-10-CM | POA: Diagnosis not present

## 2015-12-23 DIAGNOSIS — K219 Gastro-esophageal reflux disease without esophagitis: Secondary | ICD-10-CM | POA: Diagnosis present

## 2015-12-23 DIAGNOSIS — F329 Major depressive disorder, single episode, unspecified: Secondary | ICD-10-CM | POA: Diagnosis present

## 2015-12-23 DIAGNOSIS — Z96651 Presence of right artificial knee joint: Secondary | ICD-10-CM | POA: Diagnosis present

## 2015-12-23 DIAGNOSIS — M21371 Foot drop, right foot: Secondary | ICD-10-CM | POA: Diagnosis present

## 2015-12-23 DIAGNOSIS — M9701XA Periprosthetic fracture around internal prosthetic right hip joint, initial encounter: Secondary | ICD-10-CM | POA: Diagnosis present

## 2015-12-23 DIAGNOSIS — M25551 Pain in right hip: Secondary | ICD-10-CM | POA: Diagnosis present

## 2015-12-23 DIAGNOSIS — S72301A Unspecified fracture of shaft of right femur, initial encounter for closed fracture: Secondary | ICD-10-CM | POA: Diagnosis present

## 2015-12-23 LAB — COMPREHENSIVE METABOLIC PANEL
ALK PHOS: 82 U/L (ref 38–126)
ALT: 15 U/L (ref 14–54)
ANION GAP: 8 (ref 5–15)
AST: 23 U/L (ref 15–41)
Albumin: 3.1 g/dL — ABNORMAL LOW (ref 3.5–5.0)
BUN: 15 mg/dL (ref 6–20)
CALCIUM: 7.7 mg/dL — AB (ref 8.9–10.3)
CO2: 24 mmol/L (ref 22–32)
CREATININE: 0.99 mg/dL (ref 0.44–1.00)
Chloride: 96 mmol/L — ABNORMAL LOW (ref 101–111)
GFR, EST AFRICAN AMERICAN: 57 mL/min — AB (ref >60)
GFR, EST NON AFRICAN AMERICAN: 49 mL/min — AB (ref >60)
Glucose, Bld: 137 mg/dL — ABNORMAL HIGH (ref 65–99)
Potassium: 4.3 mmol/L (ref 3.5–5.1)
SODIUM: 128 mmol/L — AB (ref 135–145)
TOTAL PROTEIN: 5.3 g/dL — AB (ref 6.5–8.1)
Total Bilirubin: 0.5 mg/dL (ref 0.3–1.2)

## 2015-12-23 LAB — CBC WITH DIFFERENTIAL/PLATELET
Basophils Absolute: 0 10*3/uL (ref 0.0–0.1)
Basophils Relative: 0 %
EOS ABS: 0.1 10*3/uL (ref 0.0–0.7)
EOS PCT: 1 %
HCT: 30.6 % — ABNORMAL LOW (ref 36.0–46.0)
HEMOGLOBIN: 10.1 g/dL — AB (ref 12.0–15.0)
LYMPHS ABS: 1.4 10*3/uL (ref 0.7–4.0)
LYMPHS PCT: 15 %
MCH: 30.2 pg (ref 26.0–34.0)
MCHC: 33 g/dL (ref 30.0–36.0)
MCV: 91.6 fL (ref 78.0–100.0)
MONOS PCT: 10 %
Monocytes Absolute: 0.9 10*3/uL (ref 0.1–1.0)
NEUTROS PCT: 74 %
Neutro Abs: 7 10*3/uL (ref 1.7–7.7)
Platelets: 203 10*3/uL (ref 150–400)
RBC: 3.34 MIL/uL — AB (ref 3.87–5.11)
RDW: 13.3 % (ref 11.5–15.5)
WBC: 9.5 10*3/uL (ref 4.0–10.5)

## 2015-12-23 MED ORDER — FENTANYL CITRATE (PF) 100 MCG/2ML IJ SOLN
100.0000 ug | INTRAMUSCULAR | Status: DC | PRN
Start: 2015-12-23 — End: 2015-12-27
  Administered 2015-12-23 – 2015-12-25 (×9): 100 ug via INTRAVENOUS
  Filled 2015-12-23 (×9): qty 2

## 2015-12-23 MED ORDER — ASPIRIN EC 325 MG PO TBEC
325.0000 mg | DELAYED_RELEASE_TABLET | Freq: Every day | ORAL | Status: DC
  Administered 2015-12-24 – 2015-12-27 (×4): 325 mg via ORAL
  Filled 2015-12-23 (×4): qty 1

## 2015-12-23 MED ORDER — SODIUM CHLORIDE 0.9 % IV SOLN
INTRAVENOUS | Status: DC
  Administered 2015-12-23: 75 mL/h via INTRAVENOUS

## 2015-12-23 MED ORDER — MORPHINE SULFATE (PF) 2 MG/ML IV SOLN
2.0000 mg | INTRAVENOUS | Status: DC | PRN
Start: 2015-12-23 — End: 2015-12-27

## 2015-12-23 MED ORDER — HYDROCODONE-ACETAMINOPHEN 5-325 MG PO TABS
1.0000 | ORAL_TABLET | Freq: Four times a day (QID) | ORAL | Status: DC | PRN
Start: 2015-12-23 — End: 2015-12-27
  Administered 2015-12-25 – 2015-12-26 (×4): 1 via ORAL
  Filled 2015-12-23 (×4): qty 1

## 2015-12-23 NOTE — H&P (Signed)
[name] is an 82 y.o. female.   Chief Complaint: fall at [facility] and right femur fracture HPI: ambulates with walkers, 2 vertebroplasties  In last month.   Some weakness of LE.  Has previous THA and TKA on right by Dr. Sharol Given in past and both were doing well. Neg LOC  Past Medical History  Diagnosis Date  . Arthritis   . Osteoporosis   . Essential hypertension, benign   . Osteoarthrosis, unspecified whether generalized or localized, unspecified site   . Other malaise and fatigue     re: stress, anxiety  . Allergic rhinitis, cause unspecified   . Osteoporosis/osteopenia increased risk   . Macular degeneration   . Venous insufficiency   . Cataract cortical, senile 2010    s/p bil extract/IOL implants   . Degenerative disk disease 06/2013  . Spinal stenosis of lumbar region 07/08/2013  . Hyponatremia 07/19/2013  . Right foot drop 08/08/2013  . Mitral valve prolapse   . GERD (gastroesophageal reflux disease)   . Constipation   . Pneumonia 1920's    Past Surgical History  Procedure Laterality Date  . Total hip arthroplasty Left 2005  . Replacement total knee Right 2010  . Tonsillectomy    . Colonoscopy    . Total hip arthroplasty Right 01/02/2015  . Joint replacement    . Cataract extraction w/ intraocular lens  implant, bilateral Bilateral   . Total hip arthroplasty Right 01/02/2015    Procedure: TOTAL HIP ARTHROPLASTY;  Surgeon: Newt Minion, MD;  Location: [facility];  Service: Orthopedics;  Laterality: Right;  . Vertebroplasty  11/15/15    X2    Family History  Problem Relation Age of Onset  . Esophageal cancer Mother 10  . Breast cancer Daughter   . Thyroid disease Daughter    Social History:  reports that she quit smoking about 52 years ago. Her smoking use included Cigarettes. She has a 10 pack-year smoking history. She has never used smokeless tobacco. She reports that she drinks about 8.4 oz of alcohol per week. She reports that she does not use illicit  drugs.  Allergies: No Known Allergies   (Not in a hospital admission)  Results for orders placed or performed during the hospital encounter of 12/23/15 (from the past 48 hour(s))  CBC with Differential     Status: Abnormal   Collection Time: 12/23/15  6:57 PM  Result Value Ref Range   WBC 9.5 4.0 - 10.5 K/uL   RBC 3.34 (L) 3.87 - 5.11 MIL/uL   Hemoglobin 10.1 (L) 12.0 - 15.0 g/dL   HCT 30.6 (L) 36.0 - 46.0 %   MCV 91.6 78.0 - 100.0 fL   MCH 30.2 26.0 - 34.0 pg   MCHC 33.0 30.0 - 36.0 g/dL   RDW 13.3 11.5 - 15.5 %   Platelets 203 150 - 400 K/uL   Neutrophils Relative % 74 %   Neutro Abs 7.0 1.7 - 7.7 K/uL   Lymphocytes Relative 15 %   Lymphs Abs 1.4 0.7 - 4.0 K/uL   Monocytes Relative 10 %   Monocytes Absolute 0.9 0.1 - 1.0 K/uL   Eosinophils Relative 1 %   Eosinophils Absolute 0.1 0.0 - 0.7 K/uL   Basophils Relative 0 %   Basophils Absolute 0.0 0.0 - 0.1 K/uL  Comprehensive metabolic panel     Status: Abnormal   Collection Time: 12/23/15  7:50 PM  Result Value Ref Range   Sodium 128 (L) 135 - 145 mmol/L  Potassium 4.3 3.5 - 5.1 mmol/L   Chloride 96 (L) 101 - 111 mmol/L   CO2 24 22 - 32 mmol/L   Glucose, Bld 137 (H) 65 - 99 mg/dL   BUN 15 6 - 20 mg/dL   Creatinine, Ser 0.99 0.44 - 1.00 mg/dL   Calcium 7.7 (L) 8.9 - 10.3 mg/dL   Total Protein 5.3 (L) 6.5 - 8.1 g/dL   Albumin 3.1 (L) 3.5 - 5.0 g/dL   AST 23 15 - 41 U/L   ALT 15 14 - 54 U/L   Alkaline Phosphatase 82 38 - 126 U/L   Total Bilirubin 0.5 0.3 - 1.2 mg/dL   GFR calc non Af Amer 49 (L) >60 mL/min   GFR calc Af Amer 57 (L) >60 mL/min    Comment: (NOTE) The eGFR has been calculated using the CKD EPI equation. This calculation has not been validated in all clinical situations. eGFR's persistently <60 mL/min signify possible Chronic Kidney Disease.    Anion gap 8 5 - 15   Dg Chest 2 View  12/23/2015  CLINICAL DATA:  Fall today. EXAM: CHEST  2 VIEW COMPARISON:  December 01, 2011. FINDINGS: The heart size and  mediastinal contours are within normal limits. Both lungs are clear. No pneumothorax or pleural effusion is noted. Status post kyphoplasty of mid thoracic vertebral body. IMPRESSION: No active cardiopulmonary disease. Electronically Signed   By: Marijo Conception, M.D.   On: 12/23/2015 19:44   Dg Pelvis 1-2 Views  12/23/2015  CLINICAL DATA:  Initial encounter for fall today with right thigh pain. EXAM: PELVIS - 1-2 VIEW COMPARISON:  None. FINDINGS: Single AP view the pelvis. The femoral shaft fracture is better evaluated and visualized on femur films. Bilateral hip arthroplasties. Prior sacral plasty. Remote right pubic rami fractures. Osteopenia. IMPRESSION: Right femoral shaft fracture, better evaluated on dedicated radiographs. Electronically Signed   By: Abigail Miyamoto M.D.   On: 12/23/2015 19:43   Dg Femur, Min 2 Views Right  12/23/2015  CLINICAL DATA:  Fall today with right thigh pain. EXAM: RIGHT FEMUR 2 VIEWS COMPARISON:  12/22/2005 pelvic radiographs FINDINGS: Right hip prosthesis noted. Comminuted femoral fracture of the mid diaphysis to the distal metaphysis with 7 cm overlap and several butterfly fragments noted. Distal fracture extent extends to the margin of the femoral component of the knee prosthesis. There is deformity of the right inferior pubic ramus, likely from an old fracture. IMPRESSION: 1. Comminuted femoral fracture extends from the mid diaphysis to the distal metaphysis, with several butterfly fragments along with bony overlap and mild displacement. The distal fracture margin extends to the edge of the femoral component of the knee prosthesis. The patient also has a hip prosthesis. 2. Old healed fractures of the right pubic rami. Electronically Signed   By: Van Clines M.D.   On: 12/23/2015 19:44    Review of Systems  Eyes:       Catarracts macular degeneration  Cardiovascular:       Neg MI  Musculoskeletal: Positive for back pain, joint pain and falls.       Fall times 3 or  4 in last year  Skin: Positive for rash.  Neurological: Positive for focal weakness. Negative for headaches.       Right foot drop since 2014     Blood pressure 101/67, pulse 80, temperature 98 F (36.7 C), temperature source Oral, resp. rate 14, SpO2 94 %. Physical Exam  Constitutional: She is oriented to person, place, and  time. She appears well-developed and well-nourished.  HENT:  Head: Normocephalic.  Eyes: Right eye exhibits no discharge.  Neck: Normal range of motion.  Cardiovascular: Normal rate.   Respiratory: Effort normal.  GI: Soft. Bowel sounds are normal.  Musculoskeletal:  Right TKA and THA incisions. Midshaft deformity  Neurological: She is alert and oriented to person, place, and time.  Skin: Skin is warm and dry.  Psychiatric: She has a normal mood and affect. Her behavior is normal. Thought content normal.     Assessment/Plan Right femoral shaft fracture. Pt to be admitted and seen by Dr. Sharol Given in AM.   Rodell Perna C 12/23/2015, 8:17 PM

## 2015-12-23 NOTE — ED Notes (Signed)
Pt in from home c/o R leg pain, pt has R leg shortening & internal rotation, pt reported to have fell from standing position while ambulating with a walker, pt rcvd 250 mcg of Fentanyl pta, Pt A&O x4, follows commands, speaks in complete sentences, denies LOC

## 2015-12-23 NOTE — Progress Notes (Signed)
Orthopedic Tech Progress Note Patient Details:  [name] [birth year] [id number]  Musculoskeletal Traction Type of Traction: Bucks Skin Traction Traction Location: RLE Traction Weight: 5 lbs    Jennye MoccasinHughes, Antonella Upson Craig 12/23/2015, 11:14 PM

## 2015-12-23 NOTE — ED Notes (Signed)
Ortho at bedside.

## 2015-12-23 NOTE — ED Provider Notes (Signed)
CSN: [id number]     Arrival date & time 12/23/15  1833 History   First MD Initiated Contact with Patient 12/23/15 1842     Chief Complaint  Patient presents with  . Hip Injury  . Fall     (Consider location/radiation/quality/duration/timing/severity/associated sxs/prior Treatment) HPI  82 year old female history of osteoporosis had a mechanical fall today landing on her sacrum with instant right hip pain and fall backwards and landed on her back. Is now complaining of right hip pain but no headache, arm pain and left leg pain chest pain abdominal pain or back pain worse than normal. On further history taken this since that she recently had vertebroplasty and had been weak even after rehabilitation. This may have contributed to her fall.   Past Medical History  Diagnosis Date  . Arthritis   . Osteoporosis   . Essential hypertension, benign   . Osteoarthrosis, unspecified whether generalized or localized, unspecified site   . Other malaise and fatigue     re: stress, anxiety  . Allergic rhinitis, cause unspecified   . Osteoporosis/osteopenia increased risk   . Macular degeneration   . Venous insufficiency   . Cataract cortical, senile 2010    s/p bil extract/IOL implants   . Degenerative disk disease 06/2013  . Spinal stenosis of lumbar region 07/08/2013  . Hyponatremia 07/19/2013  . Right foot drop 08/08/2013  . Mitral valve prolapse   . GERD (gastroesophageal reflux disease)   . Constipation   . Pneumonia 1920's   Past Surgical History  Procedure Laterality Date  . Total hip arthroplasty Left 2005  . Replacement total knee Right 2010  . Tonsillectomy    . Colonoscopy    . Total hip arthroplasty Right 01/02/2015  . Joint replacement    . Cataract extraction w/ intraocular lens  implant, bilateral Bilateral   . Total hip arthroplasty Right 01/02/2015    Procedure: TOTAL HIP ARTHROPLASTY;  Surgeon: Nadara Mustard, MD;  Location: [facility];  Service: Orthopedics;  Laterality: Right;   . Vertebroplasty  11/15/15    X2   Family History  Problem Relation Age of Onset  . Esophageal cancer Mother 48  . Breast cancer Daughter   . Thyroid disease Daughter    Social History  Substance Use Topics  . Smoking status: Former Smoker -- 0.50 packs/day for 20 years    Types: Cigarettes    Quit date: 07/07/1963  . Smokeless tobacco: Never Used  . Alcohol Use: 8.4 oz/week    14 Shots of liquor per week     Comment: drinks scotch daily.    OB History    No data available     Review of Systems  Constitutional: Positive for fatigue. Negative for fever and chills.  Respiratory: Negative for cough and shortness of breath.   Cardiovascular: Negative for chest pain and palpitations.  Gastrointestinal: Negative for abdominal pain.  Musculoskeletal: Negative for myalgias.       Right hip  All other systems reviewed and are negative.     Allergies  Review of patient's allergies indicates no known allergies.  Home Medications   Prior to Admission medications   Medication Sig Start Date End Date Taking? Authorizing Provider  acetaminophen (TYLENOL) 650 MG CR tablet Take 650 mg by mouth every 6 (six) hours as needed for pain.   Yes Historical Provider, MD  amLODipine (NORVASC) 10 MG tablet Take 1 tablet (10 mg total) by mouth daily. 07/21/13  Yes Nishant Dhungel, MD  aspirin EC  81 MG tablet Take 81 mg by mouth daily.   Yes Historical Provider, MD  beta carotene w/minerals (OCUVITE) tablet Take 1 tablet by mouth daily.   Yes Historical Provider, MD  calcium carbonate (CALCIUM 600) 600 MG TABS tablet Take 600 mg by mouth 2 (two) times daily with a meal.   Yes Historical Provider, MD  DULoxetine (CYMBALTA) 60 MG capsule Take 60 mg by mouth. Take one capsule in the morning   Yes Historical Provider, MD  gabapentin (NEURONTIN) 300 MG capsule Take 300 mg by mouth 2 (two) times daily.   Yes Historical Provider, MD  olmesartan (BENICAR) 20 MG tablet Take 10 mg by mouth daily.    Yes  Historical Provider, MD  polyethylene glycol (MIRALAX / GLYCOLAX) packet Take 17 g by mouth daily.   Yes Historical Provider, MD  traMADol (ULTRAM) 50 MG tablet Take by mouth. Take one every 6 hours as needed for pain 1-5, 2 for 6-10   Yes Historical Provider, MD   BP 113/75 mmHg  Pulse 77  Temp(Src) 98 F (36.7 C) (Oral)  Resp 17  SpO2 100% Physical Exam  Constitutional: She is oriented to person, place, and time. She appears well-developed and well-nourished.  HENT:  Head: Normocephalic and atraumatic.  Neck: Normal range of motion.  Cardiovascular: Normal rate and regular rhythm.   Pulmonary/Chest: Effort normal. No stridor. No respiratory distress.  Abdominal: Soft. She exhibits no distension.  Musculoskeletal: Normal range of motion. She exhibits tenderness (ROM of right hip, shortened and internally rotated). She exhibits no edema.  Neurological: She is alert and oriented to person, place, and time.  Skin: Skin is warm and dry.  Nursing note and vitals reviewed.   ED Course  Procedures (including critical care time) Labs Review Labs Reviewed  CBC WITH DIFFERENTIAL/PLATELET - Abnormal; Notable for the following:    RBC 3.34 (*)    Hemoglobin 10.1 (*)    HCT 30.6 (*)    All other components within normal limits  COMPREHENSIVE METABOLIC PANEL - Abnormal; Notable for the following:    Sodium 128 (*)    Chloride 96 (*)    Glucose, Bld 137 (*)    Calcium 7.7 (*)    Total Protein 5.3 (*)    Albumin 3.1 (*)    GFR calc non Af Amer 49 (*)    GFR calc Af Amer 57 (*)    All other components within normal limits  URINALYSIS, ROUTINE W REFLEX MICROSCOPIC (NOT AT [facility])    Imaging Review Dg Chest 2 View  12/23/2015  CLINICAL DATA:  Fall today. EXAM: CHEST  2 VIEW COMPARISON:  December 01, 2011. FINDINGS: The heart size and mediastinal contours are within normal limits. Both lungs are clear. No pneumothorax or pleural effusion is noted. Status post kyphoplasty of mid thoracic  vertebral body. IMPRESSION: No active cardiopulmonary disease. Electronically Signed   By: Lupita RaiderJames  Green Jr, M.D.   On: 12/23/2015 19:44   Dg Pelvis 1-2 Views  12/23/2015  CLINICAL DATA:  Initial encounter for fall today with right thigh pain. EXAM: PELVIS - 1-2 VIEW COMPARISON:  None. FINDINGS: Single AP view the pelvis. The femoral shaft fracture is better evaluated and visualized on femur films. Bilateral hip arthroplasties. Prior sacral plasty. Remote right pubic rami fractures. Osteopenia. IMPRESSION: Right femoral shaft fracture, better evaluated on dedicated radiographs. Electronically Signed   By: Jeronimo GreavesKyle  Talbot M.D.   On: 12/23/2015 19:43   Dg Femur, Min 2 Views Right  12/23/2015  CLINICAL DATA:  Fall today with right thigh pain. EXAM: RIGHT FEMUR 2 VIEWS COMPARISON:  12/22/2005 pelvic radiographs FINDINGS: Right hip prosthesis noted. Comminuted femoral fracture of the mid diaphysis to the distal metaphysis with 7 cm overlap and several butterfly fragments noted. Distal fracture extent extends to the margin of the femoral component of the knee prosthesis. There is deformity of the right inferior pubic ramus, likely from an old fracture. IMPRESSION: 1. Comminuted femoral fracture extends from the mid diaphysis to the distal metaphysis, with several butterfly fragments along with bony overlap and mild displacement. The distal fracture margin extends to the edge of the femoral component of the knee prosthesis. The patient also has a hip prosthesis. 2. Old healed fractures of the right pubic rami. Electronically Signed   By: Gaylyn Rong M.D.   On: 12/23/2015 19:44   I have personally reviewed and evaluated these images and lab results as part of my medical decision-making.   EKG Interpretation None      MDM   Final diagnoses:  Fall   Likely right hip fracture. Pulses present. We'll evaluate appropriately. No evidence of any other bony injury.  Patient with the femur fracture extending  onto prosthetic knee. No other obvious injuries. Discussed case Dr. Ophelia Charter with [facility] orthopedics will admit the patient for Dr. Lajoyce Corners and likely surgery tomorrow.    Marily Memos, MD 12/24/15 [phone]

## 2015-12-23 NOTE — Progress Notes (Signed)
Attempted report x 1. Please call [phone]. Thanks.

## 2015-12-24 ENCOUNTER — Inpatient Hospital Stay (HOSPITAL_COMMUNITY): Payer: Medicare Other | Admitting: Anesthesiology

## 2015-12-24 ENCOUNTER — Inpatient Hospital Stay (HOSPITAL_COMMUNITY): Payer: Medicare Other

## 2015-12-24 ENCOUNTER — Encounter (HOSPITAL_COMMUNITY)
Admission: EM | Disposition: A | Discharge status: Skilled Nursing Facility | Payer: Self-pay | Source: Home / Self Care | Attending: Orthopedic Surgery

## 2015-12-24 ENCOUNTER — Encounter (HOSPITAL_COMMUNITY): Payer: Self-pay | Admitting: Certified Registered Nurse Anesthetist

## 2015-12-24 HISTORY — PX: ORIF FEMUR FRACTURE: SHX2119

## 2015-12-24 LAB — SURGICAL PCR SCREEN
MRSA, PCR: NEGATIVE
Staphylococcus aureus: NEGATIVE

## 2015-12-24 LAB — PREPARE RBC (CROSSMATCH)

## 2015-12-24 SURGERY — OPEN REDUCTION INTERNAL FIXATION (ORIF) DISTAL FEMUR FRACTURE
Anesthesia: General | Site: Leg Upper | Laterality: Right

## 2015-12-24 MED ORDER — SUGAMMADEX SODIUM 200 MG/2ML IV SOLN
INTRAVENOUS | Status: DC | PRN
Start: 2015-12-24 — End: 2015-12-24
  Administered 2015-12-24: 150 mg via INTRAVENOUS

## 2015-12-24 MED ORDER — GABAPENTIN 300 MG PO CAPS
300.0000 mg | ORAL_CAPSULE | Freq: Two times a day (BID) | ORAL | Status: DC
  Administered 2015-12-24 – 2015-12-27 (×6): 300 mg via ORAL
  Filled 2015-12-24 (×6): qty 1

## 2015-12-24 MED ORDER — FENTANYL CITRATE (PF) 100 MCG/2ML IJ SOLN
INTRAMUSCULAR | Status: DC | PRN
  Administered 2015-12-24 (×3): 50 ug via INTRAVENOUS

## 2015-12-24 MED ORDER — PHENYLEPHRINE HCL 10 MG/ML IJ SOLN
INTRAMUSCULAR | Status: DC | PRN
  Administered 2015-12-24 (×3): 80 ug via INTRAVENOUS

## 2015-12-24 MED ORDER — PHENYLEPHRINE HCL 10 MG/ML IJ SOLN
10.0000 mg | INTRAVENOUS | Status: DC | PRN
  Administered 2015-12-24: 25 ug/min via INTRAVENOUS

## 2015-12-24 MED ORDER — DULOXETINE HCL 60 MG PO CPEP
60.0000 mg | ORAL_CAPSULE | Freq: Every day | ORAL | Status: DC
  Administered 2015-12-25 – 2015-12-27 (×3): 60 mg via ORAL
  Filled 2015-12-24 (×3): qty 1

## 2015-12-24 MED ORDER — AMLODIPINE BESYLATE 10 MG PO TABS
10.0000 mg | ORAL_TABLET | Freq: Every day | ORAL | Status: DC
  Administered 2015-12-24 – 2015-12-27 (×4): 10 mg via ORAL
  Filled 2015-12-24 (×4): qty 1

## 2015-12-24 MED ORDER — DOCUSATE SODIUM 100 MG PO CAPS
100.0000 mg | ORAL_CAPSULE | Freq: Two times a day (BID) | ORAL | Status: DC
  Administered 2015-12-24 – 2015-12-27 (×6): 100 mg via ORAL
  Filled 2015-12-24 (×6): qty 1

## 2015-12-24 MED ORDER — FENTANYL CITRATE (PF) 250 MCG/5ML IJ SOLN
INTRAMUSCULAR | Status: AC
  Filled 2015-12-24: qty 5

## 2015-12-24 MED ORDER — IRBESARTAN 75 MG PO TABS
37.5000 mg | ORAL_TABLET | Freq: Every day | ORAL | Status: DC
Start: 2015-12-24 — End: 2015-12-27
  Administered 2015-12-25 – 2015-12-27 (×4): 37.5 mg via ORAL
  Filled 2015-12-24 (×4): qty 0.5

## 2015-12-24 MED ORDER — FENTANYL CITRATE (PF) 100 MCG/2ML IJ SOLN
25.0000 ug | INTRAMUSCULAR | Status: DC | PRN
  Administered 2015-12-24 (×3): 25 ug via INTRAVENOUS

## 2015-12-24 MED ORDER — ONDANSETRON HCL 4 MG/2ML IJ SOLN
INTRAMUSCULAR | Status: DC | PRN
  Administered 2015-12-24: 4 mg via INTRAVENOUS

## 2015-12-24 MED ORDER — CEFAZOLIN SODIUM 1-5 GM-% IV SOLN
1.0000 g | Freq: Four times a day (QID) | INTRAVENOUS | Status: AC
  Administered 2015-12-25 (×2): 1 g via INTRAVENOUS
  Filled 2015-12-24 (×4): qty 50

## 2015-12-24 MED ORDER — SUCCINYLCHOLINE CHLORIDE 20 MG/ML IJ SOLN
INTRAMUSCULAR | Status: DC | PRN
  Administered 2015-12-24: 100 mg via INTRAVENOUS

## 2015-12-24 MED ORDER — BISACODYL 5 MG PO TBEC: 5.0000 mg | DELAYED_RELEASE_TABLET | Freq: Every day | ORAL | Status: DC | PRN

## 2015-12-24 MED ORDER — OXYCODONE HCL 5 MG PO TABS: 5.0000 mg | ORAL_TABLET | ORAL | Status: DC | PRN

## 2015-12-24 MED ORDER — METHOCARBAMOL 1000 MG/10ML IJ SOLN
500.0000 mg | Freq: Four times a day (QID) | INTRAVENOUS | Status: DC | PRN
  Filled 2015-12-24: qty 5

## 2015-12-24 MED ORDER — SODIUM CHLORIDE 0.9 % IV SOLN
INTRAVENOUS | Status: DC
  Administered 2015-12-24: 10 mL/h via INTRAVENOUS

## 2015-12-24 MED ORDER — OCUVITE PO TABS
1.0000 | ORAL_TABLET | Freq: Every day | ORAL | Status: DC
  Administered 2015-12-25 – 2015-12-27 (×3): 1 via ORAL
  Filled 2015-12-24 (×3): qty 1

## 2015-12-24 MED ORDER — BUPIVACAINE HCL (PF) 0.25 % IJ SOLN
INTRAMUSCULAR | Status: AC
  Filled 2015-12-24: qty 30

## 2015-12-24 MED ORDER — SODIUM CHLORIDE 0.45 % IV SOLN: INTRAVENOUS | Status: DC

## 2015-12-24 MED ORDER — ONDANSETRON HCL 4 MG/2ML IJ SOLN: 4.0000 mg | Freq: Four times a day (QID) | INTRAMUSCULAR | Status: DC | PRN

## 2015-12-24 MED ORDER — HYDROMORPHONE HCL 1 MG/ML IJ SOLN: 1.0000 mg | INTRAMUSCULAR | Status: DC | PRN

## 2015-12-24 MED ORDER — METOCLOPRAMIDE HCL 5 MG PO TABS: 5.0000 mg | ORAL_TABLET | Freq: Three times a day (TID) | ORAL | Status: DC | PRN

## 2015-12-24 MED ORDER — ASPIRIN EC 81 MG PO TBEC: 81.0000 mg | DELAYED_RELEASE_TABLET | Freq: Every day | ORAL | Status: DC

## 2015-12-24 MED ORDER — MAGNESIUM CITRATE PO SOLN: 1.0000 | Freq: Once | ORAL | Status: DC | PRN

## 2015-12-24 MED ORDER — PROPOFOL 10 MG/ML IV BOLUS
INTRAVENOUS | Status: DC | PRN
  Administered 2015-12-24: 80 mg via INTRAVENOUS

## 2015-12-24 MED ORDER — METOCLOPRAMIDE HCL 5 MG/ML IJ SOLN: 5.0000 mg | Freq: Three times a day (TID) | INTRAMUSCULAR | Status: DC | PRN

## 2015-12-24 MED ORDER — ONDANSETRON HCL 4 MG PO TABS: 4.0000 mg | ORAL_TABLET | Freq: Four times a day (QID) | ORAL | Status: DC | PRN

## 2015-12-24 MED ORDER — ACETAMINOPHEN 650 MG RE SUPP
650.0000 mg | Freq: Four times a day (QID) | RECTAL | Status: DC | PRN
Start: 2015-12-24 — End: 2015-12-27

## 2015-12-24 MED ORDER — FENTANYL CITRATE (PF) 100 MCG/2ML IJ SOLN
INTRAMUSCULAR | Status: AC
  Filled 2015-12-24: qty 2

## 2015-12-24 MED ORDER — CEFAZOLIN SODIUM-DEXTROSE 2-3 GM-% IV SOLR
INTRAVENOUS | Status: DC | PRN
  Administered 2015-12-24: 2 g via INTRAVENOUS

## 2015-12-24 MED ORDER — SUGAMMADEX SODIUM 200 MG/2ML IV SOLN
INTRAVENOUS | Status: AC
  Filled 2015-12-24: qty 2

## 2015-12-24 MED ORDER — LIDOCAINE HCL (CARDIAC) 20 MG/ML IV SOLN
INTRAVENOUS | Status: DC | PRN
  Administered 2015-12-24: 60 mg via INTRAVENOUS

## 2015-12-24 MED ORDER — 0.9 % SODIUM CHLORIDE (POUR BTL) OPTIME
TOPICAL | Status: DC | PRN
  Administered 2015-12-24: 1000 mL

## 2015-12-24 MED ORDER — POLYETHYLENE GLYCOL 3350 17 G PO PACK
17.0000 g | PACK | Freq: Every day | ORAL | Status: DC
  Administered 2015-12-25 – 2015-12-27 (×2): 17 g via ORAL
  Filled 2015-12-24 (×2): qty 1

## 2015-12-24 MED ORDER — METHOCARBAMOL 500 MG PO TABS
500.0000 mg | ORAL_TABLET | Freq: Four times a day (QID) | ORAL | Status: DC | PRN
  Administered 2015-12-25: 500 mg via ORAL
  Filled 2015-12-24: qty 1

## 2015-12-24 MED ORDER — LACTATED RINGERS IV SOLN: INTRAVENOUS | Status: DC

## 2015-12-24 MED ORDER — POLYETHYLENE GLYCOL 3350 17 G PO PACK
17.0000 g | PACK | Freq: Every day | ORAL | Status: DC | PRN
  Administered 2015-12-27: 17 g via ORAL

## 2015-12-24 MED ORDER — ALBUMIN HUMAN 5 % IV SOLN
INTRAVENOUS | Status: DC | PRN
  Administered 2015-12-24: 18:00:00 via INTRAVENOUS

## 2015-12-24 MED ORDER — ACETAMINOPHEN 325 MG PO TABS
650.0000 mg | ORAL_TABLET | Freq: Four times a day (QID) | ORAL | Status: DC | PRN
  Administered 2015-12-25: 325 mg via ORAL
  Filled 2015-12-24: qty 2

## 2015-12-24 MED ORDER — LACTATED RINGERS IV SOLN
INTRAVENOUS | Status: DC | PRN
  Administered 2015-12-24: 18:00:00 via INTRAVENOUS

## 2015-12-24 MED ORDER — ROCURONIUM BROMIDE 100 MG/10ML IV SOLN
INTRAVENOUS | Status: DC | PRN
  Administered 2015-12-24: 30 mg via INTRAVENOUS

## 2015-12-24 MED ORDER — CEFAZOLIN SODIUM-DEXTROSE 2-3 GM-% IV SOLR: 2.0000 g | INTRAVENOUS | Status: DC

## 2015-12-24 MED ORDER — LACTATED RINGERS IV SOLN
INTRAVENOUS | Status: DC
  Administered 2015-12-24 (×2): via INTRAVENOUS

## 2015-12-24 SURGICAL SUPPLY — 73 items
BIT DRILL 4.3 (BIT) ×2
BIT DRILL 4.3MM (BIT) ×1
BIT DRILL 4.3X300MM (BIT) ×1 IMPLANT
BLADE SURG 10 STRL SS (BLADE) ×3 IMPLANT
BLADE SURG ROTATE 9660 (MISCELLANEOUS) IMPLANT
BNDG COHESIVE 6X5 TAN STRL LF (GAUZE/BANDAGES/DRESSINGS) ×6 IMPLANT
BNDG GAUZE ELAST 4 BULKY (GAUZE/BANDAGES/DRESSINGS) ×3 IMPLANT
CABLE (Orthopedic Implant) ×6 IMPLANT
CAP LOCK NCB (Cap) ×18 IMPLANT
CLEANER TIP ELECTROSURG 2X2 (MISCELLANEOUS) ×3 IMPLANT
COVER MAYO STAND STRL (DRAPES) ×3 IMPLANT
COVER SURGICAL LIGHT HANDLE (MISCELLANEOUS) ×6 IMPLANT
CUFF TOURNIQUET SINGLE 34IN LL (TOURNIQUET CUFF) IMPLANT
CUFF TOURNIQUET SINGLE 44IN (TOURNIQUET CUFF) IMPLANT
DRAPE C-ARM 42X72 X-RAY (DRAPES) IMPLANT
DRAPE IMP U-DRAPE 54X76 (DRAPES) ×3 IMPLANT
DRAPE INCISE IOBAN 66X45 STRL (DRAPES) ×3 IMPLANT
DRAPE ORTHO SPLIT 77X108 STRL (DRAPES) ×2
DRAPE STERI IOBAN 125X83 (DRAPES) ×3 IMPLANT
DRAPE SURG ORHT 6 SPLT 77X108 (DRAPES) ×1 IMPLANT
DRAPE U-SHAPE 47X51 STRL (DRAPES) ×3 IMPLANT
DRSG ADAPTIC 3X8 NADH LF (GAUZE/BANDAGES/DRESSINGS) ×3 IMPLANT
DRSG MEPILEX BORDER 4X4 (GAUZE/BANDAGES/DRESSINGS) ×3 IMPLANT
DRSG MEPILEX BORDER 4X8 (GAUZE/BANDAGES/DRESSINGS) ×6 IMPLANT
DRSG PAD ABDOMINAL 8X10 ST (GAUZE/BANDAGES/DRESSINGS) ×6 IMPLANT
DURAPREP 26ML APPLICATOR (WOUND CARE) ×3 IMPLANT
ELECT REM PT RETURN 9FT ADLT (ELECTROSURGICAL) ×3
ELECTRODE REM PT RTRN 9FT ADLT (ELECTROSURGICAL) ×1 IMPLANT
EVACUATOR 1/8 PVC DRAIN (DRAIN) IMPLANT
GAUZE SPONGE 4X4 12PLY STRL (GAUZE/BANDAGES/DRESSINGS) ×6 IMPLANT
GLOVE BIOGEL PI IND STRL 9 (GLOVE) ×1 IMPLANT
GLOVE BIOGEL PI INDICATOR 9 (GLOVE) ×2
GLOVE SURG ORTHO 9.0 STRL STRW (GLOVE) ×3 IMPLANT
GOWN STRL REUS W/ TWL XL LVL3 (GOWN DISPOSABLE) ×3 IMPLANT
GOWN STRL REUS W/TWL XL LVL3 (GOWN DISPOSABLE) ×6
KIT BASIN OR (CUSTOM PROCEDURE TRAY) ×3 IMPLANT
KIT ROOM TURNOVER OR (KITS) ×3 IMPLANT
LOCKPLATE CABLE BUTTON NCP HIP (Orthopedic Implant) ×6 IMPLANT
MANIFOLD NEPTUNE II (INSTRUMENTS) ×3 IMPLANT
NEEDLE 22X1 1/2 (OR ONLY) (NEEDLE) ×3 IMPLANT
NS IRRIG 1000ML POUR BTL (IV SOLUTION) ×3 IMPLANT
PACK GENERAL/GYN (CUSTOM PROCEDURE TRAY) ×3 IMPLANT
PACK ORTHO EXTREMITY (CUSTOM PROCEDURE TRAY) IMPLANT
PACK UNIVERSAL I (CUSTOM PROCEDURE TRAY) ×3 IMPLANT
PAD ARMBOARD 7.5X6 YLW CONV (MISCELLANEOUS) ×6 IMPLANT
PLATE DISTAL FEMUR 15H 317M RT (Plate) ×3 IMPLANT
SCREW 5.0 70MM (Screw) ×3 IMPLANT
SCREW CORT NCB SELFTAP 5.0X50 (Screw) ×6 IMPLANT
SCREW CORTICAL NCB 5.0X40 (Screw) ×3 IMPLANT
SCREW NCB 3.5X75X5X6.2XST (Screw) ×2 IMPLANT
SCREW NCB 5.0X55MM (Screw) ×3 IMPLANT
SCREW NCB 5.0X75MM (Screw) ×4 IMPLANT
SPONGE LAP 18X18 X RAY DECT (DISPOSABLE) ×6 IMPLANT
STAPLER VISISTAT 35W (STAPLE) ×3 IMPLANT
STOCKINETTE IMPERVIOUS LG (DRAPES) ×3 IMPLANT
SUCTION FRAZIER TIP 10 FR DISP (SUCTIONS) ×3 IMPLANT
SUT ETHILON 2 0 PSLX (SUTURE) IMPLANT
SUT ETHILON 4 0 PS 2 18 (SUTURE) IMPLANT
SUT VIC AB 0 CT1 27 (SUTURE) ×4
SUT VIC AB 0 CT1 27XBRD ANBCTR (SUTURE) ×2 IMPLANT
SUT VIC AB 0 CTB1 27 (SUTURE) ×3 IMPLANT
SUT VIC AB 1 CT1 27 (SUTURE) ×4
SUT VIC AB 1 CT1 27XBRD ANBCTR (SUTURE) ×2 IMPLANT
SUT VIC AB 1 CTB1 27 (SUTURE) ×3 IMPLANT
SUT VIC AB 2-0 CTB1 (SUTURE) ×3 IMPLANT
SYR 20ML ECCENTRIC (SYRINGE) ×3 IMPLANT
TAPE CLOTH SURG 6X10 WHT LF (GAUZE/BANDAGES/DRESSINGS) ×3 IMPLANT
TOWEL OR 17X24 6PK STRL BLUE (TOWEL DISPOSABLE) ×3 IMPLANT
TOWEL OR 17X26 10 PK STRL BLUE (TOWEL DISPOSABLE) ×3 IMPLANT
TUBE CONNECTING 12'X1/4 (SUCTIONS) ×1
TUBE CONNECTING 12X1/4 (SUCTIONS) ×2 IMPLANT
WATER STERILE IRR 1000ML POUR (IV SOLUTION) ×6 IMPLANT
YANKAUER SUCT BULB TIP NO VENT (SUCTIONS) ×3 IMPLANT

## 2015-12-24 NOTE — Op Note (Signed)
12/23/2015 - 12/24/2015  6:54 PM  PATIENT:  [name]    PRE-OPERATIVE DIAGNOSIS:  Comminuted right femur fracture periprosthetic with total hip and total knee replacement  POST-OPERATIVE DIAGNOSIS:  Same  PROCEDURE:  OPEN REDUCTION INTERNAL FIXATION (ORIF) RIGHT FEMUR FRACTURE  SURGEON:  Nadara MustardUDA,MARCUS V, MD  PHYSICIAN ASSISTANT:None ANESTHESIA:   General  PREOPERATIVE INDICATIONS:  [name] is a  82 y.o. female with a diagnosis of Comminuted right femur fracture who failed conservative measures and elected for surgical management.    The risks benefits and alternatives were discussed with the patient preoperatively including but not limited to the risks of infection, bleeding, nerve injury, cardiopulmonary complications, the need for revision surgery, among others, and the patient was willing to proceed.  OPERATIVE IMPLANTS: Zimmer plate and cable system, transfusion of 2 units packed red blood cells in the recovery room.  OPERATIVE FINDINGS: Very soft osteoporotic bone  OPERATIVE PROCEDURE: Patient was brought to the operating room and underwent a general anesthetic. After adequate levels anesthesia obtained patient [facility] her right lower extremity was placed in boot traction and the fracture reduced using C-arm fluoroscopy and the left lower extremity was protected with the foot resting on a foot plate. C-arm fluoroscopy verified alignment the fracture site. Patient's right lower extremity was prepped using DuraPrep draped into a sterile field. A timeout was called. A proximal and distal incision was made proximally just distal to the lesser trochanter and distally over the distal femur. A plate was then chosen white cells and from just below the greater trochanter to the femoral condyles. The plate was then advanced submuscularly from proximal to distal this was aligned. A cable was then used proximally to secure the plate midshaft proximal aspect  with the cables surrounding the bone and had additional stability of the femoral implant. Distally the plate was then placed center line of the femoral condyles and 6 screws were placed distally. The screws had a very poor bite and the locking screws were placed on top to make this a locked blade plate construction. Additional screw was then placed midshaft to further capture of the fragments. A additional cable was then placed proximally to further stabilize the proximal construct. C-arm fluoroscopy verified alignment. The wounds were irrigated with normal saline. The fascia was closed using #1 Vicryl subcutaneous is closed using 0 Vicryl the skin was closed using staples. Mepilex dressing was applied patient was extubated taken to the PACU in stable condition. Plan for 2 units of packed red blood cells in the recovery room.

## 2015-12-24 NOTE — Progress Notes (Signed)
Utilization review completed.  

## 2015-12-24 NOTE — Progress Notes (Signed)
Patient ID: [name], female   DOB: May 18, 1924, 82 y.o.   MRN: [id number] Patient is a 82 year old woman who was using her walker the walker struck the chair well when she was turning she fell sustaining a twisting injury and sustained a comminuted spiral fracture of her right femur. Patient is currently in Buck's traction. We'll plan for internal fixation with a plate with distal locking screws proximal cable support. Plan for add-on surgery today. Risk and benefits were discussed patient states she understands wish to proceed at this time plan for discharge back to rehabilitation facility at [facility].

## 2015-12-24 NOTE — Transfer of Care (Signed)
Immediate Anesthesia Transfer of Care Note  Patient: [name]  Procedure(s) Performed: Procedure(s): OPEN REDUCTION INTERNAL FIXATION (ORIF) RIGHT FEMUR FRACTURE (Right)  Patient Location: PACU  Anesthesia Type:General  Level of Consciousness: awake, alert , patient cooperative and responds to stimulation  Airway & Oxygen Therapy: Patient Spontanous Breathing and Patient connected to nasal cannula oxygen  Post-op Assessment: Report given to RN, Post -op Vital signs reviewed and stable and Patient moving all extremities X 4  Post vital signs: Reviewed and stable  Last Vitals:  Filed Vitals:   12/24/15 1509 12/24/15 1901  BP: 155/83 154/58  Pulse:  95  Temp: 37.2 C 36.6 C  Resp: 18 17    Complications: No apparent anesthesia complications

## 2015-12-24 NOTE — Clinical Social Work Note (Signed)
Per chart review, patient admitted from [facility].  CSW to continue to follow and assist with discharge planning needs.  Monica Deistmily Mykal Batiz, LCSW [phone] Orthopedics: [phone] Surgical: [phone]

## 2015-12-24 NOTE — Progress Notes (Signed)
Anesthesia MD called to verify need for blood products b/c blood bank called PACU and informed that 2 U PRBC's were ready.  MD does not want to transfuse pt unless pt becomes symptomatic and BP drops.  Will continue to monitor and notify for further changes.

## 2015-12-24 NOTE — Anesthesia Postprocedure Evaluation (Signed)
Anesthesia Post Note  Patient: [name]  Procedure(s) Performed: Procedure(s) (LRB): OPEN REDUCTION INTERNAL FIXATION (ORIF) RIGHT FEMUR FRACTURE (Right)  Patient location during evaluation: PACU Anesthesia Type: General Level of consciousness: awake and alert Pain management: pain level controlled Vital Signs Assessment: post-procedure vital signs reviewed and stable Respiratory status: spontaneous breathing, nonlabored ventilation, respiratory function stable and patient connected to nasal cannula oxygen Cardiovascular status: blood pressure returned to baseline and stable Postop Assessment: no signs of nausea or vomiting Anesthetic complications: no    Last Vitals:  Filed Vitals:   12/24/15 1930 12/24/15 1945  BP: 133/60 134/73  Pulse: 87 105  Temp:  36.6 C  Resp: 11 13    Last Pain:  Filed Vitals:   12/24/15 2031  PainSc: 0-No pain        RLE Motor Response: Purposeful movement;No tremor;Responds to commands (12/24/15 2026) RLE Sensation: Full sensation;Pain;No numbness;No tingling (12/24/15 2026)      Brien Lowe,W. EDMOND

## 2015-12-24 NOTE — Anesthesia Preprocedure Evaluation (Addendum)
Anesthesia Evaluation  Patient identified by MRN, date of birth, ID band Patient awake    Reviewed: Allergy & Precautions, NPO status , Patient's Chart, lab work & pertinent test results  Airway Mallampati: II  TM Distance: >3 FB Neck ROM: Full    Dental  (+) Teeth Intact   Pulmonary former smoker,    breath sounds clear to auscultation       Cardiovascular hypertension, Pt. on medications + Peripheral Vascular Disease   Rhythm:Regular Rate:Normal     Neuro/Psych PSYCHIATRIC DISORDERS Depression    GI/Hepatic negative GI ROS, Neg liver ROS, GERD  ,  Endo/Other  negative endocrine ROS  Renal/GU negative Renal ROS  negative genitourinary   Musculoskeletal  (+) Arthritis ,   Abdominal   Peds negative pediatric ROS (+)  Hematology   Anesthesia Other Findings   Reproductive/Obstetrics negative OB ROS                            Lab Results  Component Value Date   WBC 9.5 12/23/2015   HGB 10.1* 12/23/2015   HCT 30.6* 12/23/2015   MCV 91.6 12/23/2015   PLT 203 12/23/2015   Lab Results  Component Value Date   CREATININE 0.99 12/23/2015   BUN 15 12/23/2015   NA 128* 12/23/2015   K 4.3 12/23/2015   CL 96* 12/23/2015   CO2 24 12/23/2015   Lab Results  Component Value Date   INR 0.95 11/15/2015   INR 1.05 10/22/2015   INR 0.97 12/24/2014   EKG: normal sinus rhythm.    Anesthesia Physical Anesthesia Plan  ASA: III  Anesthesia Plan: General   Post-op Pain Management:    Induction: Intravenous  Airway Management Planned: Oral ETT  Additional Equipment:   Intra-op Plan:   Post-operative Plan: Extubation in OR  Informed Consent: I have reviewed the patients History and Physical, chart, labs and discussed the procedure including the risks, benefits and alternatives for the proposed anesthesia with the patient or authorized representative who has indicated his/her  understanding and acceptance.     Plan Discussed with: CRNA  Anesthesia Plan Comments:         Anesthesia Quick Evaluation

## 2015-12-24 NOTE — Anesthesia Procedure Notes (Signed)
Procedure Name: Intubation Date/Time: 12/24/2015 5:17 PM Performed by: Dairl PonderJIANG, Miamor Ayler Pre-anesthesia Checklist: Patient identified, Emergency Drugs available, Suction available, Patient being monitored and Timeout performed Patient Re-evaluated:Patient Re-evaluated prior to inductionOxygen Delivery Method: Circle system utilized Preoxygenation: Pre-oxygenation with 100% oxygen Intubation Type: IV induction Ventilation: Mask ventilation without difficulty Laryngoscope Size: Glidescope and 3 Grade View: Grade I Tube type: Oral Number of attempts: 1 Airway Equipment and Method: Stylet Placement Confirmation: ETT inserted through vocal cords under direct vision,  positive ETCO2 and breath sounds checked- equal and bilateral Secured at: 22 cm Tube secured with: Tape Dental Injury: Teeth and Oropharynx as per pre-operative assessment

## 2015-12-25 ENCOUNTER — Encounter (HOSPITAL_COMMUNITY): Payer: Self-pay | Admitting: Orthopedic Surgery

## 2015-12-25 LAB — CBC WITH DIFFERENTIAL/PLATELET
Basophils Absolute: 0 10*3/uL (ref 0.0–0.1)
Basophils Relative: 0 %
Eosinophils Absolute: 0 10*3/uL (ref 0.0–0.7)
Eosinophils Relative: 0 %
HEMATOCRIT: 21.7 % — AB (ref 36.0–46.0)
HEMOGLOBIN: 7.3 g/dL — AB (ref 12.0–15.0)
LYMPHS ABS: 2.3 10*3/uL (ref 0.7–4.0)
LYMPHS PCT: 21 %
MCH: 30.8 pg (ref 26.0–34.0)
MCHC: 33.6 g/dL (ref 30.0–36.0)
MCV: 91.6 fL (ref 78.0–100.0)
MONO ABS: 1.3 10*3/uL — AB (ref 0.1–1.0)
MONOS PCT: 12 %
NEUTROS ABS: 7 10*3/uL (ref 1.7–7.7)
NEUTROS PCT: 66 %
Platelets: 206 10*3/uL (ref 150–400)
RBC: 2.37 MIL/uL — ABNORMAL LOW (ref 3.87–5.11)
RDW: 13.4 % (ref 11.5–15.5)
WBC: 10.5 10*3/uL (ref 4.0–10.5)

## 2015-12-25 LAB — PREPARE RBC (CROSSMATCH)

## 2015-12-25 MED ORDER — SODIUM CHLORIDE 0.9 % IV SOLN
Freq: Once | INTRAVENOUS | Status: AC
  Administered 2015-12-25: 07:00:00 via INTRAVENOUS

## 2015-12-25 NOTE — Progress Notes (Signed)
Called security for [place] returned to pt with no complains everything was there

## 2015-12-25 NOTE — NC FL2 (Signed)
[place] MEDICAID FL2 LEVEL OF CARE SCREENING TOOL     IDENTIFICATION  Patient Name: [name] Birthdate: 11/23/1924 Sex: female Admission Date (Current Location): 12/23/2015  [facility] [place]   Facility and Address:  [facility], [address], [place], [place] [address]      Provider Number: [id number]  Attending Physician Name and Address:  Nadara MustardMarcus Duda V, MD  Relative Name and Phone Number:   (Dtr: Kenn FileJill Dull  [phone]:  as well as another dtr and two sons )    Current Level of Care: Hospital Recommended Level of Care: Skilled Nursing Facility Prior Approval Number:    Date Approved/Denied:   PASRR Number:    Discharge Plan: SNF    Current Diagnoses: Patient Active Problem List   Diagnosis Date Noted  . Right femoral shaft fracture (HCC) 12/23/2015  . Memory loss 12/12/2015  . Thoracic compression fracture (HCC) 10/31/2015  . Major depression (HCC) 10/31/2015  . Vitamin D deficiency 10/31/2015  . Acute post-hemorrhagic anemia 01/14/2015  . Osteoarthritis of right hip 01/08/2015  . S/P total hip arthroplasty 01/02/2015  . Right foot drop 08/08/2013  . Low back pain 07/20/2013  . Hyponatremia 07/19/2013  . Essential hypertension, benign   . Sacral fracture, closed (HCC) 07/08/2013  . Right hip pain 07/08/2013  . Constipation due to pain medication 07/08/2013  . Spinal stenosis of lumbar region 07/08/2013    Orientation RESPIRATION BLADDER Height & Weight    Self, Time, Situation, Place  Normal Continent   134 lbs.  BEHAVIORAL SYMPTOMS/MOOD NEUROLOGICAL BOWEL NUTRITION STATUS      Continent Diet (Carb Mod Fluid consistency)  AMBULATORY STATUS COMMUNICATION OF NEEDS Skin   Extensive Assist Verbally Surgical wounds, Skin abrasions (Rt Hip and Rt leg )                       Personal Care Assistance Level of Assistance  Bathing, Feeding, Dressing Bathing Assistance: Limited assistance Feeding  assistance: Independent Dressing Assistance: Maximum assistance     Functional Limitations Info  Sight Sight Info: Impaired (Macular degeneration,  Cataract cortical)        SPECIAL CARE FACTORS FREQUENCY  PT (By licensed PT)     PT Frequency:  (x3 per week )              Contractures Contractures Info: Not present    Additional Factors Info  Code Status, Allergies Code Status Info:  (DNR) Allergies Info:  (NKDA)           Current Medications (12/25/2015):  This is the current hospital active medication list Current Facility-Administered Medications  Medication Dose Route Frequency Provider Last Rate Last Dose  . 0.9 %  sodium chloride infusion   Intravenous Continuous Eldred MangesMark C Yates, MD 75 mL/hr at 12/23/15 2213 75 mL/hr at 12/23/15 2213  . 0.9 %  sodium chloride infusion   Intravenous Continuous Nadara MustardMarcus Duda V, MD 10 mL/hr at 12/24/15 2015 10 mL/hr at 12/24/15 2015  . 0.9 %  sodium chloride infusion   Intravenous Once Aldean BakerMarcus Duda V, MD      . acetaminophen (TYLENOL) tablet 650 mg  650 mg Oral Q6H PRN Nadara MustardMarcus Duda V, MD       Or  . acetaminophen (TYLENOL) suppository 650 mg  650 mg Rectal Q6H PRN Nadara MustardMarcus Duda V, MD      . amLODipine (NORVASC) tablet 10 mg  10 mg Oral Daily Aldean BakerMarcus Duda  V, MD   10 mg at 12/25/15 1010  . aspirin EC tablet 325 mg  325 mg Oral Daily Eldred Manges, MD   325 mg at 12/25/15 1010  . beta carotene w/minerals (OCUVITE) tablet 1 tablet  1 tablet Oral Daily Nadara Mustard, MD   1 tablet at 12/25/15 1010  . bisacodyl (DULCOLAX) EC tablet 5 mg  5 mg Oral Daily PRN Aldean Baker V, MD      . ceFAZolin (ANCEF) IVPB 1 g/50 mL premix  1 g Intravenous Q6H Nadara Mustard, MD   1 g at 12/25/15 0811  . docusate sodium (COLACE) capsule 100 mg  100 mg Oral BID Nadara Mustard, MD   100 mg at 12/25/15 1010  . DULoxetine (CYMBALTA) DR capsule 60 mg  60 mg Oral Daily Nadara Mustard, MD   60 mg at 12/25/15 1011  . fentaNYL (SUBLIMAZE) injection 100 mcg  100 mcg Intravenous Q1H  PRN Marily Memos, MD   100 mcg at 12/25/15 0522  . gabapentin (NEURONTIN) capsule 300 mg  300 mg Oral BID Nadara Mustard, MD   300 mg at 12/25/15 1010  . HYDROcodone-acetaminophen (NORCO/VICODIN) 5-325 MG per tablet 1-2 tablet  1-2 tablet Oral Q6H PRN Eldred Manges, MD   1 tablet at 12/25/15 1102  . HYDROmorphone (DILAUDID) injection 1 mg  1 mg Intravenous Q2H PRN Nadara Mustard, MD      . irbesartan (AVAPRO) tablet 37.5 mg  37.5 mg Oral Daily Aldean Baker V, MD   37.5 mg at 12/25/15 1012  . lactated ringers infusion   Intravenous Continuous Nadara Mustard, MD 50 mL/hr at 12/24/15 1553    . magnesium citrate solution 1 Bottle  1 Bottle Oral Once PRN Nadara Mustard, MD      . methocarbamol (ROBAXIN) tablet 500 mg  500 mg Oral Q6H PRN Nadara Mustard, MD   500 mg at 12/25/15 1102   Or  . methocarbamol (ROBAXIN) 500 mg in dextrose 5 % 50 mL IVPB  500 mg Intravenous Q6H PRN Nadara Mustard, MD      . metoCLOPramide (REGLAN) tablet 5-10 mg  5-10 mg Oral Q8H PRN Nadara Mustard, MD       Or  . metoCLOPramide (REGLAN) injection 5-10 mg  5-10 mg Intravenous Q8H PRN Aldean Baker V, MD      . morphine 2 MG/ML injection 2 mg  2 mg Intravenous Q1H PRN Eldred Manges, MD      . ondansetron [facility]) tablet 4 mg  4 mg Oral Q6H PRN Nadara Mustard, MD       Or  . ondansetron [facility]) injection 4 mg  4 mg Intravenous Q6H PRN Aldean Baker V, MD      . oxyCODONE (Oxy IR/ROXICODONE) immediate release tablet 5-10 mg  5-10 mg Oral Q3H PRN Nadara Mustard, MD      . polyethylene glycol (MIRALAX / GLYCOLAX) packet 17 g  17 g Oral Daily Nadara Mustard, MD   17 g at 12/25/15 1012  . polyethylene glycol (MIRALAX / GLYCOLAX) packet 17 g  17 g Oral Daily PRN Nadara Mustard, MD         Discharge Medications: Please see discharge summary for a list of discharge medications.  Relevant Imaging Results:  Relevant Lab Results:   Additional Information  (SSN#    [id number])  Leron Croak

## 2015-12-25 NOTE — Clinical Social Work Note (Signed)
Clinical Social Work Assessment  Patient Details  Name: [name] MRN: [id number] Date of Birth: [birth year]  Date of referral:  12/25/15               Reason for consult:  Facility Placement, Discharge Planning                Permission sought to share information with:  Facility Art therapist granted to share information::  Yes, Verbal Permission Granted  Name::      (Admission Coordinator at [facility])  Agency::   ([facility])  Relationship::   (None given)  Contact Information:   (None Given )  Housing/Transportation Living arrangements for the past 2 months:  [place] ([facility] ) Source of Information:  Patient Patient Interpreter Needed:  None Criminal Activity/Legal Involvement Pertinent to Current Situation/Hospitalization:  No - Comment as needed Significant Relationships:  Adult Children, Warehouse manager, Other(Comment) (Friends at facility) Lives with:  Facility Resident Do you feel safe going back to the place where you live?  Yes Need for family participation in patient care:  No (Coment) (Pt did not given permission to speak with her daughter )  Care giving concerns:  Pt has no care giving concerns at this time. Pt is fully comfortable living at [facility] and wants to return for SNF services at that facility.    Social Worker assessment / plan:  CSW met with the Pt at the bedside to discuss d/c planning back to [facility]. Pt verified that she is from [place] and has been living there for 12 years. Pt stated that she is a widow and that it is nice to have a facility that takes care of her. Pt stated that "the reason she fell was because she was bending over, with her walker, to pick up something and fell forward. Pt stated that she has experienced small falls but has been ok from those. Pt does have a relationship with her daughter, however the Pt stated there was no need to contact her. Pt wanted to limit assessment  time due to the need to use the restroom. CSW assisted Pt in contacting assistance and received permission to contact [facility] to coordinate the d/c planning back to facility. Pt was pleasant and smiled during the assessment.   Employment status:   disabled  Nurse, adult, Other (Comment Required) (UHC Medicare and AARP Medicare Complete ) PT Recommendations:  [place] / Referral to community resources:  [place]  Patient/Family's Response to care:  Pt was pleased with the care she has received while her at [facility] and also especially with her MD that is treating her for her fracture.   Patient/Family's Understanding of and Emotional Response to Diagnosis, Current Treatment, and Prognosis:  Pt did not express concern about her fracture, however was wanting to get out of bed to use the restroom. CSW encouraged Pt to wait for PT to visit her and give the staff time to come and assist her. Pt voiced understanding and will contact staff for assistance. Pt also realizes the need for SNF and voiced approval of placement.   Emotional Assessment Appearance:  Appears stated age Attitude/Demeanor/Rapport:    Affect (typically observed):  Accepting, Appropriate, Calm, Hopeful, Stable Orientation:  Oriented to Self, Oriented to Place, Oriented to  Time, Oriented to Situation Alcohol / Substance use:  Not Applicable Psych involvement (Current and /or in the community):  No (Comment)  Discharge Needs  Concerns to  be addressed:  Discharge Planning Concerns, Adjustment to Illness Readmission within the last 30 days:  No Current discharge risk:  Dependent with Mobility Barriers to Discharge:  Continued Medical Work up   [employer] 12/25/2015, 10:34 AM

## 2015-12-25 NOTE — Progress Notes (Signed)
Patient ID: [name], female   DOB: 1924-05-16, 82 y.o.   MRN: [id number] Patient is postoperative day 1 internal fixation for comminuted right femur fracture.  Patient did have acute blood loss anemia from the fracture. She received 2 units of packed red blood cells postoperatively. Her hemoglobin is currently 7.3 and we will type and cross and transfuse with 2 additional units of packed red blood cells.  [facility] and [facility] be able [facility]. Physical therapy for transfer training nonweightbearing on the right lower extremity.

## 2015-12-25 NOTE — Progress Notes (Signed)
PT Cancellation Note  Patient Details Name: [name] MRN: [id number] DOB: 09/29/1924   Cancelled Treatment:    Reason Eval/Treat Not Completed: Medical issues which prohibited therapy.  Pt on bedrest and awaiting transfusion.  Will check later to see if pt able to do therapy.   Ivar DrapeStout, Yaviel Kloster E 12/25/2015, 11:07 AM   Samul Dadauth Sariya Trickey, PT MS Acute Rehab Dept. Number: [facility] [phone] and [facility] [phone]

## 2015-12-26 LAB — TYPE AND SCREEN
ABO/RH(D): O POS
Antibody Screen: NEGATIVE
Unit division: 0
Unit division: 0

## 2015-12-26 NOTE — Progress Notes (Signed)
Patient ID: [name], female   DOB: [birth year], 82 y.o.   MRN: [id number] Postoperative day 2 (reduction internal fixation periprosthetic right femur fracture. Patient states she's still uncomfortable. Patient may use her right lower extremity for transfers only but do not want her gait training. Okay for touchdown weightbearing for transfers on the right lower extremity. Plan for discharge to skilled nursing at [facility] on Friday.

## 2015-12-26 NOTE — Progress Notes (Signed)
Discharge bedrest per Dr. Lajoyce Cornersuda per verbal

## 2015-12-26 NOTE — Care Management Important Message (Signed)
Important Message  Patient Details  Name: [name] MRN: [id number] Date of Birth: 04-Aug-1924   Medicare Important Message Given:  Yes    [name] 12/26/2015, 1:55 PM

## 2015-12-26 NOTE — Evaluation (Signed)
Physical Therapy Evaluation Patient Details Name: [name] MRN: [id number] DOB: 1924/08/08 Today's Date: 12/26/2015   History of Present Illness  82 yo female with periprosthetic fracture and NWB on RLE was admitted for ORIF and now is expected to go back to SNF.  Pt also planning to go to ALF from there.   Clinical Impression  Pt was seen for assessment of her mobility with bedrest limitation and MD has restricted her WBing on RLE to NWB.  Her plan is to mobilize as MD permits and will continue to work toward standing on LLE and maintaining WBlimit for return home when able.    Follow Up Recommendations      Equipment Recommendations       Recommendations for Other Services       Precautions / Restrictions Precautions Precautions: Fall;Other (comment) (NWB RLE) Restrictions Weight Bearing Restrictions: Yes RLE Weight Bearing: Non weight bearing      Mobility  Bed Mobility Overal bed mobility: Needs Assistance Bed Mobility: Supine to Sit;Sit to Supine     Supine to sit: Max assist;Mod assist Sit to supine: Mod assist;Max assist;+2 for physical assistance;+2 for safety/equipment   General bed mobility comments: had second person for safety mainly as one person did move her  Transfers                 General transfer comment: did not perform as pt on bedrest  Ambulation/Gait                Stairs            Wheelchair Mobility    Modified Rankin (Stroke Patients Only)       Balance Overall balance assessment: History of Falls (sit balance fair to fair-. fluctuates)                                           Pertinent Vitals/Pain Pain Assessment: 0-10 Pain Score: 0-No pain Pain Location: R hip at rest Pain Intervention(s): Monitored during session;Premedicated before session;Repositioned    Home Living Family/patient expects to be discharged to:: Skilled nursing facility                      Prior Function  Level of Independence: Independent with assistive device(s)         Comments: [place] walker at [place]     Hand Dominance   Dominant Hand: Right    Extremity/Trunk Assessment   Upper Extremity Assessment: Generalized weakness           Lower Extremity Assessment: Generalized weakness;RLE deficits/detail RLE Deficits / Details: hardware added for her periprosthetic fracture    Cervical / Trunk Assessment: Kyphotic  Communication   Communication: No difficulties  Cognition Arousal/Alertness: Awake/alert Behavior During Therapy: WFL for tasks assessed/performed Overall Cognitive Status: Within Functional Limits for tasks assessed                      General Comments General comments (skin integrity, edema, etc.): Has decreased control of mobiltiy and is in some pain with movement, but did maintain O2 sat with cannula and recent transfusion    Exercises        Assessment/Plan    PT Assessment    PT Diagnosis     PT Problem List    PT Treatment Interventions     PT  Goals (Current goals can be found in the Care Plan section) Acute Rehab PT Goals Patient Stated Goal: to get to ALF after SNF    Frequency     Barriers to discharge        Co-evaluation               End of Session Equipment Utilized During Treatment: Gait belt;Oxygen Activity Tolerance: Patient tolerated treatment well;Patient limited by fatigue;Patient limited by lethargy;Patient limited by pain Patient left: in bed;with call bell/phone within reach;with bed alarm set;with family/visitor present Nurse Communication: Mobility status         Time: 1131-1158 PT Time Calculation (min) (ACUTE ONLY): 27 min   Charges:   PT Evaluation $PT Eval Moderate Complexity: 1 Procedure PT Treatments $Therapeutic Activity: 8-22 mins   PT G Codes:        Ivar Drape 2016/01/16, 2:49 PM  Samul Dada, PT MS Acute Rehab Dept. Number: [facility] [id number] and [facility] [phone]

## 2015-12-26 NOTE — Plan of Care (Signed)
Problem: Acute Rehab PT Goals(only PT should resolve) Goal: Pt Will Perform Standing Balance Or Pre-Gait Using WBing as tolerated

## 2015-12-27 LAB — URINE MICROSCOPIC-ADD ON: WBC, UA: NONE SEEN WBC/hpf (ref 0–5)

## 2015-12-27 LAB — URINALYSIS, ROUTINE W REFLEX MICROSCOPIC
Bilirubin Urine: NEGATIVE
GLUCOSE, UA: NEGATIVE mg/dL
Ketones, ur: NEGATIVE mg/dL
Leukocytes, UA: NEGATIVE
Nitrite: NEGATIVE
PH: 6 (ref 5.0–8.0)
Protein, ur: NEGATIVE mg/dL
SPECIFIC GRAVITY, URINE: 1.009 (ref 1.005–1.030)

## 2015-12-27 MED ORDER — HYDROMORPHONE HCL 2 MG PO TABS: 1.0000 mg | ORAL_TABLET | ORAL | Status: DC | PRN

## 2015-12-27 MED ORDER — TAMSULOSIN HCL 0.4 MG PO CAPS: 0.4000 mg | ORAL_CAPSULE | Freq: Every day | ORAL | Status: DC

## 2015-12-27 MED ORDER — MEPERIDINE HCL 50 MG PO TABS: 50.0000 mg | ORAL_TABLET | ORAL | Status: DC | PRN

## 2015-12-27 MED ORDER — OXYCODONE-ACETAMINOPHEN 5-325 MG PO TABS: 1.0000 | ORAL_TABLET | ORAL | Status: DC | PRN

## 2015-12-27 MED ORDER — TAMSULOSIN HCL 0.4 MG PO CAPS
0.4000 mg | ORAL_CAPSULE | Freq: Every day | ORAL | Status: DC
  Administered 2015-12-27: 0.4 mg via ORAL
  Filled 2015-12-27: qty 1

## 2015-12-27 NOTE — Discharge Summary (Signed)
Physician Discharge Summary  Patient ID: [name] MRN: [id number] DOB/AGE: [birth year] 82 y.o.  Admit date: 12/23/2015 Discharge date: 12/27/2015  Admission Diagnoses: Periprosthetic right femur fracture  Discharge Diagnoses:  Active Problems:   Right femoral shaft fracture [facility](HCC)   Discharged Condition: stable  Hospital Course: Patient's hospital course was essentially unremarkable. She underwent open reduction internal fixation for the periprosthetic femur fracture. Postoperatively patient was stable and she was discharged to home in stable condition. Patient did receive packed red blood cells transfusion due to acute blood loss from the femur fracture.  Consults: None  Significant Diagnostic Studies: labs: Routine labs  Treatments: surgery: See operative note  Discharge Exam: Blood pressure 108/54, pulse 90, temperature 99 F (37.2 C), temperature source Oral, resp. rate 20, SpO2 92 %. Incision/Wound: dressing clean and dry  Disposition: 07-Left Against Medical Advice     Medication List    ASK your doctor about these medications        acetaminophen 650 MG CR tablet  Commonly known as:  TYLENOL  Take 650 mg by mouth every 6 (six) hours as needed for pain.     amLODipine 10 MG tablet  Commonly known as:  NORVASC  Take 1 tablet (10 mg total) by mouth daily.     aspirin EC 81 MG tablet  Take 81 mg by mouth daily.     beta carotene w/minerals tablet  Take 1 tablet by mouth daily.     CALCIUM 600 600 MG Tabs tablet  Generic drug:  calcium carbonate  Take 600 mg by mouth 2 (two) times daily with a meal.     DULoxetine 60 MG capsule  Commonly known as:  CYMBALTA  Take 60 mg by mouth. Take one capsule in the morning     gabapentin 300 MG capsule  Commonly known as:  NEURONTIN  Take 300 mg by mouth 2 (two) times daily.     olmesartan 20 MG tablet  Commonly known as:  BENICAR  Take 10 mg by mouth daily.     polyethylene glycol packet  Commonly known as:   MIRALAX / GLYCOLAX  Take 17 g by mouth daily.     traMADol 50 MG tablet  Commonly known as:  ULTRAM  Take by mouth. Take one every 6 hours as needed for pain 1-5, 2 for 6-10           Follow-up Information    Follow up with Santrice Muzio V, MD In 2 weeks.   Specialty:  Orthopedic Surgery   Contact information:   [address] [place] [address] [phone]       Signed: Nadara MustardDUDA,[name] 12/27/2015, 6:21 AM

## 2015-12-27 NOTE — Progress Notes (Signed)
Pt unable to void after perioperative foley removed. Pt was bladder scanned and had an Intermitt cath placed x2 used to drain the bladder during day shift. Pt bladder scanned on night shift with a total of in her bladder. Pt drinking however still unable to void on her on. Per protocol a 14' foley catheter was inserted to stay. Will pass on for day shift to address.

## 2015-12-27 NOTE — Discharge Planning (Addendum)
Patient to be discharged to [facility].  Facility: [facility] RN report number: [phone] Transportation: [facility]  RN to call for [facility] once patient medically stable for discharge. [facility] (before 5pm) [phone] [facility] (after 5pm) [phone]  Marcelline Deistmily Keyarah Mcroy, LCSW [phone] Orthopedics: [phone] Surgical: [phone]

## 2015-12-27 NOTE — Progress Notes (Signed)
Called Dr Lajoyce Cornersuda regarding discharge waiting for call back foley has been pulled and [name] is due to void

## 2015-12-31 ENCOUNTER — Encounter: Payer: Self-pay | Admitting: Internal Medicine

## 2015-12-31 ENCOUNTER — Non-Acute Institutional Stay (SKILLED_NURSING_FACILITY): Payer: Medicare Other | Admitting: Internal Medicine

## 2015-12-31 DIAGNOSIS — M810 Age-related osteoporosis without current pathological fracture: Secondary | ICD-10-CM

## 2015-12-31 DIAGNOSIS — K5903 Drug induced constipation: Secondary | ICD-10-CM

## 2015-12-31 DIAGNOSIS — I10 Essential (primary) hypertension: Secondary | ICD-10-CM | POA: Diagnosis not present

## 2015-12-31 DIAGNOSIS — R296 Repeated falls: Secondary | ICD-10-CM

## 2015-12-31 DIAGNOSIS — S72351E Displaced comminuted fracture of shaft of right femur, subsequent encounter for open fracture type I or II with routine healing: Secondary | ICD-10-CM | POA: Diagnosis not present

## 2015-12-31 DIAGNOSIS — D62 Acute posthemorrhagic anemia: Secondary | ICD-10-CM | POA: Diagnosis not present

## 2015-12-31 DIAGNOSIS — R413 Other amnesia: Secondary | ICD-10-CM

## 2015-12-31 DIAGNOSIS — Z789 Other specified health status: Secondary | ICD-10-CM

## 2015-12-31 NOTE — Progress Notes (Signed)
Patient ID: [name], female   DOB: January 07, 1924, 82 y.o.   MRN: [id number]  Provider:  Gwenith Spitz. Renato Gails, D.O., C.M.D. Location:  [facility] PCP: Hoyle Sauer, MD  Code Status: DNR Goals of Care: Advanced Directive information Does patient have an advance directive?: Yes, Type of Advance Directive: Healthcare Power of Stollings;Living will;Out of facility DNR (pink MOST or yellow form), Pre-existing out of facility DNR order (yellow form or pink MOST form): Yellow form placed in chart (order not valid for inpatient use)   Chief Complaint  Patient presents with  . New Admit To SNF    to Rehab following hospitalization after under going open reduction internal fixation for right femoral shaft fracture by Dr. Lajoyce Corners. Also received packed red blood cells transfusion due to acute blood loss from femoral fracture.    HPI: Patient is a 82 y.o. female with h/o senile osteoporosis with multiple prior fractures, some worsening cognitive impairment, lumbar spinal stenosis, osteoarthritis, venous insufficiency, depression and chronic venous insufficiency seen today for admission to [facility] s/p her latest fall with fracture.  On 12/23/15, she sustained a fall from the standing position at home when walking with her walker.  She sustained a right femoral shaft fracture (comminuted spiral fracture that extended into her prosthetic knee).  Initially, it was placed in Buck's traction from 1/2-1/3, and then she underwent internal fixation with a plate and distal locking screws and proximal cable support.  Her bones were noted to be very soft and osteoporotic.  She recently had her first reclast infusion through her PCP.  She suffered acute blood loss anemia and was transfused 4 total units of PRBCs.  She was made WBAT on 1/5.  She is to f/u with Dr. Lajoyce Corners in 2 wks.  She also has had a right knee abrasion and multiple areas of bruising on her right side.  She was sent with a vast array of narcotic  selections and I was asked to narrow this with her history of severe delirium from her narcotics in the past and her ongoing cocktail intake.  When seen, she was mildly confused.  She denied any pain at rest, but admits to severe pain with any movement of the right leg.  She also has some postnasal drip and requested her nightly 2oz of hard liquor.  Review of Systems  Constitutional: Positive for malaise/fatigue. Negative for fever and chills.  HENT: Negative for congestion.   Eyes: Negative for blurred vision.       Glasses  Respiratory: Negative for shortness of breath.   Cardiovascular: Positive for leg swelling. Negative for chest pain.       Chronic venous insufficiency  Gastrointestinal: Positive for constipation. Negative for abdominal pain, blood in stool and melena.  Genitourinary: Negative for dysuria.  Musculoskeletal: Positive for back pain, joint pain and falls.  Skin: Negative for rash.       Bruises and excoriations from fall and surgery  Neurological: Positive for weakness. Negative for dizziness and loss of consciousness.  Psychiatric/Behavioral: Positive for memory loss. Negative for depression. The patient is not nervous/anxious and does not have insomnia.     Past Medical History  Diagnosis Date  . Arthritis   . Osteoporosis   . Essential hypertension, benign   . Osteoarthrosis, unspecified whether generalized or localized, unspecified site   . Other malaise and fatigue     re: stress, anxiety  . Allergic rhinitis, cause unspecified   . Osteoporosis/osteopenia increased risk   . Macular degeneration   .  Venous insufficiency   . Cataract cortical, senile 2010    s/p bil extract/IOL implants   . Degenerative disk disease 06/2013  . Spinal stenosis of lumbar region 07/08/2013  . Hyponatremia 07/19/2013  . Right foot drop 08/08/2013  . Mitral valve prolapse   . GERD (gastroesophageal reflux disease)   . Constipation   . Pneumonia 1920's  . Chronic kidney disease  (CKD), stage III (moderate)   . Mood disorder (HCC)   . History of right hip replacement   . Sacral disorder   . Constipation   . Senile osteoporosis   . Abnormal heart rhythms   . Anxiety   . Pleural effusion     Left  . Multiple rib fractures   . Anemia   . Vitamin D deficiency   . Degenerative joint disease    Past Surgical History  Procedure Laterality Date  . Total hip arthroplasty Left 2005  . Replacement total knee Right 2010  . Tonsillectomy    . Colonoscopy    . Total hip arthroplasty Right 01/02/2015  . Joint replacement    . Cataract extraction w/ intraocular lens  implant, bilateral Bilateral   . Total hip arthroplasty Right 01/02/2015    Procedure: TOTAL HIP ARTHROPLASTY;  Surgeon: Nadara Mustard, MD;  Location: [facility] OR;  Service: Orthopedics;  Laterality: Right;  . Vertebroplasty  11/15/15    X2  . Orif femur fracture Right 12/24/2015    Procedure: OPEN REDUCTION INTERNAL FIXATION (ORIF) RIGHT FEMUR FRACTURE;  Surgeon: Nadara Mustard, MD;  Location: [facility] OR;  Service: Orthopedics;  Laterality: Right;    reports that she quit smoking about 52 years ago. Her smoking use included Cigarettes. She has a 10 pack-year smoking history. She has never used smokeless tobacco. She reports that she drinks about 8.4 oz of alcohol per week. She reports that she does not use illicit drugs. Family History  Problem Relation Age of Onset  . Esophageal cancer Mother 34  . Breast cancer Daughter   . Thyroid disease Daughter     Allergies  Allergen Reactions  . Ace Inhibitors       Medication List       This list is accurate as of: 12/31/15 11:59 PM.  Always use your most recent med list.               acetaminophen 650 MG CR tablet  Commonly known as:  TYLENOL  Take 650 mg by mouth every 6 (six) hours as needed for pain.     amLODipine 10 MG tablet  Commonly known as:  NORVASC  Take 1 tablet (10 mg total) by mouth daily.     aspirin EC 81 MG tablet  Take 81 mg by mouth  daily.     beta carotene w/minerals tablet  Take 1 tablet by mouth daily.     CALCIUM 600 600 MG Tabs tablet  Generic drug:  calcium carbonate  Take 600 mg by mouth 2 (two) times daily with a meal.     DULoxetine 60 MG capsule  Commonly known as:  CYMBALTA  Take 60 mg by mouth. Take one capsule in the morning     gabapentin 300 MG capsule  Commonly known as:  NEURONTIN  Take 300 mg by mouth 2 (two) times daily.     olmesartan 20 MG tablet  Commonly known as:  BENICAR  Take 10 mg by mouth daily.     polyethylene glycol packet  Commonly known as:  MIRALAX / GLYCOLAX  Take 17 g by mouth daily.     traMADol 50 MG tablet  Commonly known as:  ULTRAM  Take by mouth. Take one every 6 hours as needed for pain 1-5, 2 for 6-10        Physical Exam: Filed Vitals:   12/31/15 1342  BP: 131/67  Pulse: 76  Temp: 98.1 F (36.7 C)  Resp: 12  Height: 5\' 3"  (1.6 m)  Weight: 126 lb (57.153 kg)  SpO2: 98%   Body mass index is 22.33 kg/(m^2). Physical Exam  Constitutional: She appears well-developed and well-nourished. No distress.  HENT:  Head: Normocephalic and atraumatic.  Right Ear: External ear normal.  Left Ear: External ear normal.  Nose: Nose normal.  Mouth/Throat: Oropharynx is clear and moist. No oropharyngeal exudate.  Eyes: Conjunctivae and EOM are normal. Pupils are equal, round, and reactive to light.  Neck: Normal range of motion. Neck supple. No JVD present. No tracheal deviation present. No thyromegaly present.  Cardiovascular: Normal rate, regular rhythm, normal heart sounds and intact distal pulses.   Pulmonary/Chest: Effort normal and breath sounds normal. No respiratory distress.  Abdominal: Soft. Bowel sounds are normal. She exhibits no distension and no mass. There is no tenderness.  Lymphadenopathy:    She has no cervical adenopathy.  Neurological: She is alert.  Oriented to person, place, not time  Skin: Skin is warm and dry.  Two long incisions on  right lateral hip and thigh with 2 small incisions between all with minimal erythema, warmth, drainage at this time, well-approximated  Psychiatric: She has a normal mood and affect.  Pleasantly confused at present    Labs reviewed: Basic Metabolic Panel:  Recent Labs  40/98/11 1109  11/15/15 0921 11/28/15 12/23/15 1950  NA 128*  < > 128* 137 128*  K 3.4*  < > 3.6 3.8 4.3  CL 86*  --  93*  --  96*  CO2 30  --  27  --  24  GLUCOSE 120*  --  97  --  137*  BUN 12  < > 16 16 15   CREATININE 0.91  < > 1.03* 0.7 0.99  CALCIUM 9.3  --  9.3  --  7.7*  < > = values in this interval not displayed. Liver Function Tests:  Recent Labs  12/23/15 1950  AST 23  ALT 15  ALKPHOS 82  BILITOT 0.5  PROT 5.3*  ALBUMIN 3.1*   No results for input(s): LIPASE, AMYLASE in the last 8760 hours. No results for input(s): AMMONIA in the last 8760 hours. CBC:  Recent Labs  11/15/15 0921 12/23/15 1857 12/25/15 0459  WBC 10.4 9.5 10.5  NEUTROABS  --  7.0 7.0  HGB 12.1 10.1* 7.3*  HCT 36.3 30.6* 21.7*  MCV 94.3 91.6 91.6  PLT 262 203 206   Cardiac Enzymes: No results for input(s): CKTOTAL, CKMB, CKMBINDEX, TROPONINI in the last 8760 hours. BNP: Invalid input(s): POCBNP No results found for: HGBA1C Lab Results  Component Value Date   TSH 2.047 07/20/2013   No results found for: VITAMINB12 No results found for: FOLATE No results found for: IRON, TIBC, FERRITIN  Imaging and Procedures obtained prior to SNF admission: Dg Chest 2 View  12/23/2015  CLINICAL DATA:  Fall today. EXAM: CHEST  2 VIEW COMPARISON:  December 01, 2011. FINDINGS: The heart size and mediastinal contours are within normal limits. Both lungs are clear. No pneumothorax or pleural effusion is noted. Status post kyphoplasty of mid  thoracic vertebral body. IMPRESSION: No active cardiopulmonary disease. Electronically Signed   By: Lupita Raider, M.D.   On: 12/23/2015 19:44   Dg Pelvis 1-2 Views  12/23/2015  CLINICAL DATA:   Initial encounter for fall today with right thigh pain. EXAM: PELVIS - 1-2 VIEW COMPARISON:  None. FINDINGS: Single AP view the pelvis. The femoral shaft fracture is better evaluated and visualized on femur films. Bilateral hip arthroplasties. Prior sacral plasty. Remote right pubic rami fractures. Osteopenia. IMPRESSION: Right femoral shaft fracture, better evaluated on dedicated radiographs. Electronically Signed   By: Jeronimo Greaves M.D.   On: 12/23/2015 19:43   Dg C-arm 1-60 Min-no Report  12/24/2015  CLINICAL DATA: surgery, elective C-ARM 1-60 MINUTES Fluoroscopy was utilized by the requesting physician.  No radiographic interpretation.   Dg Femur 1v Right  12/24/2015  CLINICAL DATA:  82 year old female with a history of fracture repair EXAM: RIGHT FEMUR 1 VIEW; DG C-ARM 1-60 MIN-NO REPORT COMPARISON:  12/23/2015 FINDINGS: Intraoperative fluoroscopic spot images during femur repair. 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 Similar appearance of total right knee arthroplasty. Interval placement of a lateral buttress plate fixation spanning right femoral diaphyseal fracture, with distal interlocking screw fixation and proximal cerclage wires. IMPRESSION: Limited intraoperative fluoroscopic spot images of open reduction internal fixation of femoral diaphyseal fracture with buttress plate and screw/ cerclage wire fixation, as above. Please refer to the dictated operative report for full details of intraoperative findings and procedure. Signed, Yvone Neu. Loreta Ave, DO [facility] Electronically Signed   By: Gilmer Mor D.O.   On: 12/24/2015 19:09   Dg Femur, Min 2 Views Right  12/23/2015  CLINICAL DATA:  Fall today with right thigh pain. EXAM: RIGHT FEMUR 2 VIEWS COMPARISON:  12/22/2005 pelvic radiographs FINDINGS: Right hip prosthesis noted. Comminuted femoral fracture of the mid diaphysis to the distal metaphysis with 7 cm overlap and  several butterfly fragments noted. Distal fracture extent extends to the margin of the femoral component of the knee prosthesis. There is deformity of the right inferior pubic ramus, likely from an old fracture. IMPRESSION: 1. Comminuted femoral fracture extends from the mid diaphysis to the distal metaphysis, with several butterfly fragments along with bony overlap and mild displacement. The distal fracture margin extends to the edge of the femoral component of the knee prosthesis. The patient also has a hip prosthesis. 2. Old healed fractures of the right pubic rami. Electronically Signed   By: Gaylyn Rong M.D.   On: 12/23/2015 19:44    Assessment/Plan 1. Type I or II open displaced comminuted fracture of shaft of right femur with routine healing, subsequent encounter -will modify pain regimen to simplify so we know what she is reacting to when she has increased confusion -tramadol scheduled 1 50mg  tab q 6 hrs and may have additional tramadol 50mg  po q 6 hrs prn moderate pain  -dilaudid 1mg  po q 4 hrs prn severe pain unresponsive to tramadol  -no demerol, hydrocodone, oxycodone, fentanyl -also may not have her 2oz of liquor if she's had the dilaudid w/in 12 hrs. -cont home cymbalta and neurontin  2. Senile osteoporosis -cont calcium and should also have vitamin D in it -just had reclast infusion a few weeks ago  3. Constipation due to pain medication -cont miralax and home senna 3 tabs daily  4. Essential hypertension, benign -bp at goal with amlodipine and benicar  5. Postoperative anemia due to acute blood loss -f/u cbc in 1-2  wks  6. Memory loss -seems this is getting worse lately, but also may have some reversible delirium going on with her pain, pain meds, different location, alcohol use -monitor -she really should not go back home alone this time due to fall risk and decreased ability to perform self-care   7. Multiple falls -with injury -she ideally should be at least  in AL level of care when rehab completed or have 24 hr care at home  8. Active advance directive - DNR (Do Not Resuscitate)  Functional status: requiring help with adls except feeding self at present, had been ambulating with walker, working with PT, OT again now  Family/ staff Communication: discussed with rehab nursing  Labs/tests ordered:  Will need cbc in 1-2 wks  Jeda Pardue L. Timithy Arons, D.O. Geriatrics [facility] [address], [place] [address] Cell Phone (Mon-Fri 8am-5pm):  [phone] On Call:  [phone] & follow prompts after 5pm & weekends Office Phone:  [phone] Office Fax:  [phone]

## 2016-01-07 ENCOUNTER — Encounter: Payer: Self-pay | Admitting: Internal Medicine

## 2016-01-07 ENCOUNTER — Non-Acute Institutional Stay (SKILLED_NURSING_FACILITY): Payer: Medicare Other | Admitting: Internal Medicine

## 2016-01-07 DIAGNOSIS — R0982 Postnasal drip: Secondary | ICD-10-CM | POA: Diagnosis not present

## 2016-01-07 DIAGNOSIS — S72351E Displaced comminuted fracture of shaft of right femur, subsequent encounter for open fracture type I or II with routine healing: Secondary | ICD-10-CM | POA: Diagnosis not present

## 2016-01-07 DIAGNOSIS — J309 Allergic rhinitis, unspecified: Secondary | ICD-10-CM

## 2016-01-07 DIAGNOSIS — J45909 Unspecified asthma, uncomplicated: Secondary | ICD-10-CM | POA: Diagnosis not present

## 2016-01-07 NOTE — Progress Notes (Signed)
Patient ID: [name], female   DOB: 09/21/1924, 82 y.o.   MRN: [id number]  Location:  [facility] Provider:  Gwenith Spitz. Renato Gails, D.O., C.M.D. Hoyle Sauer, MD  Code Status:  DNR Goals of care: Advanced Directive information Does patient have an advance directive?: Yes, Type of Advance Directive: Healthcare Power of [place];Living will;Out of facility DNR (pink MOST or yellow form), Pre-existing out of facility DNR order (yellow form or pink MOST form): Yellow form placed in chart (order not valid for inpatient use)  Chief Complaint  Patient presents with  . Acute Visit    HPI:  Pt is a 82 y.o. white female seen today for an acute visit due to increased erythema of her distal incision site with some inflammation present and at patient's request due to postnasal drip ongoing despite allergy medication just started yesterday (antihistamine)    She reports no increase in warmth or tenderness of the distal site, but it is a bit more erythematous than the others and inflamed.     Review of Systems  Constitutional: Negative for fever and chills.  HENT: Positive for congestion. Negative for sore throat.        Postnasal drip  Respiratory: Positive for cough. Negative for shortness of breath.   Cardiovascular: Negative for chest pain and leg swelling.  Gastrointestinal: Negative for abdominal pain.  Genitourinary: Negative for dysuria.  Musculoskeletal: Positive for joint pain.       With movement  Psychiatric/Behavioral: Positive for memory loss.    Past Medical History  Diagnosis Date  . Arthritis   . Osteoporosis   . Essential hypertension, benign   . Osteoarthrosis, unspecified whether generalized or localized, unspecified site   . Other malaise and fatigue     re: stress, anxiety  . Allergic rhinitis, cause unspecified   . Osteoporosis/osteopenia increased risk   . Macular degeneration   . Venous insufficiency   . Cataract cortical, senile 2010    s/p bil  extract/IOL implants   . Degenerative disk disease 06/2013  . Spinal stenosis of lumbar region 07/08/2013  . Hyponatremia 07/19/2013  . Right foot drop 08/08/2013  . Mitral valve prolapse   . GERD (gastroesophageal reflux disease)   . Constipation   . Pneumonia 1920's  . Chronic kidney disease (CKD), stage III (moderate)   . Mood disorder (HCC)   . History of right hip replacement   . Sacral disorder   . Constipation   . Senile osteoporosis   . Abnormal heart rhythms   . Anxiety   . Pleural effusion     Left  . Multiple rib fractures   . Anemia   . Vitamin D deficiency   . Degenerative joint disease    Past Surgical History  Procedure Laterality Date  . Total hip arthroplasty Left 2005  . Replacement total knee Right 2010  . Tonsillectomy    . Colonoscopy    . Total hip arthroplasty Right 01/02/2015  . Joint replacement    . Cataract extraction w/ intraocular lens  implant, bilateral Bilateral   . Total hip arthroplasty Right 01/02/2015    Procedure: TOTAL HIP ARTHROPLASTY;  Surgeon: Nadara Mustard, MD;  Location: [facility] OR;  Service: Orthopedics;  Laterality: Right;  . Vertebroplasty  11/15/15    X2  . Orif femur fracture Right 12/24/2015    Procedure: OPEN REDUCTION INTERNAL FIXATION (ORIF) RIGHT FEMUR FRACTURE;  Surgeon: Nadara Mustard, MD;  Location: [facility] OR;  Service: Orthopedics;  Laterality: Right;  Allergies  Allergen Reactions  . Ace Inhibitors       Medication List       This list is accurate as of: 01/07/16 11:59 PM.  Always use your most recent med list.               acetaminophen 650 MG CR tablet  Commonly known as:  TYLENOL  Take 650 mg by mouth every 6 (six) hours as needed for pain.     amLODipine 10 MG tablet  Commonly known as:  NORVASC  Take 1 tablet (10 mg total) by mouth daily.     aspirin EC 81 MG tablet  Take 81 mg by mouth daily.     beta carotene w/minerals tablet  Take 1 tablet by mouth daily.     CALCIUM 600 600 MG Tabs tablet   Generic drug:  calcium carbonate  Take 600 mg by mouth 2 (two) times daily with a meal.     DILAUDID 2 MG tablet  Generic drug:  HYDROmorphone  Take by mouth. Take 1/2 tablet every 4 hours as needed severe pain 6-10, unrelieved with Tramadol     DULoxetine 60 MG capsule  Commonly known as:  CYMBALTA  Take 60 mg by mouth. Take one capsule in the morning     gabapentin 300 MG capsule  Commonly known as:  NEURONTIN  Take 300 mg by mouth 2 (two) times daily.     olmesartan 20 MG tablet  Commonly known as:  BENICAR  Take 10 mg by mouth daily.     polyethylene glycol packet  Commonly known as:  MIRALAX / GLYCOLAX  Take 17 g by mouth daily.     traMADol 50 MG tablet  Commonly known as:  ULTRAM  Take by mouth. Take one every 6 hours as needed for pain 1-5, 2 for 6-10        Immunization History  Administered Date(s) Administered  . Influenza-Unspecified 09/20/2014  . Pneumococcal Conjugate-13 04/25/2014  . Pneumococcal Polysaccharide-23 09/20/2008  . Td 03/23/2013  . Zoster 12/21/2006   Pertinent  Health Maintenance Due  Topic Date Due  . DEXA SCAN  09/10/1991  . INFLUENZA VACCINE  07/22/2015  . PNA vac Low Risk Adult  Completed   No flowsheet data found.  Filed Vitals:   01/07/16 1018  BP: 113/62  Pulse: 72  Temp: 97.8 F (36.6 C)  Resp: 18  Height: 5\' 3"  (1.6 m)  Weight: 134 lb 9.6 oz (61.054 kg)  SpO2: 91%   Body mass index is 23.85 kg/(m^2). Physical Exam  Constitutional: She appears well-developed and well-nourished.  HENT:  Mouth/Throat: Oropharyngeal exudate present.  Postnasal drip  Skin: Skin is warm and dry.  Mild increase in erythema and warmth on my exam, plus more at distal wound lateral to knee    Labs reviewed:  Recent Labs  10/22/15 1109  11/15/15 0921 11/28/15 12/23/15 1950  NA 128*  < > 128* 137 128*  K 3.4*  < > 3.6 3.8 4.3  CL 86*  --  93*  --  96*  CO2 30  --  27  --  24  GLUCOSE 120*  --  97  --  137*  BUN 12  < > 16 16 15    CREATININE 0.91  < > 1.03* 0.7 0.99  CALCIUM 9.3  --  9.3  --  7.7*  < > = values in this interval not displayed.  Recent Labs  12/23/15 1950  AST 23  ALT 15  ALKPHOS 82  BILITOT 0.5  PROT 5.3*  ALBUMIN 3.1*    Recent Labs  11/15/15 0921 12/23/15 1857 12/25/15 0459  WBC 10.4 9.5 10.5  NEUTROABS  --  7.0 7.0  HGB 12.1 10.1* 7.3*  HCT 36.3 30.6* 21.7*  MCV 94.3 91.6 91.6  PLT 262 203 206   Lab Results  Component Value Date   TSH 2.047 07/20/2013   No results found for: HGBA1C No results found for: CHOL, HDL, LDLCALC, LDLDIRECT, TRIG, CHOLHDL  Significant Diagnostic Results in last 30 days:  Dg Chest 2 View  12/23/2015  CLINICAL DATA:  Fall today. EXAM: CHEST  2 VIEW COMPARISON:  December 01, 2011. FINDINGS: The heart size and mediastinal contours are within normal limits. Both lungs are clear. No pneumothorax or pleural effusion is noted. Status post kyphoplasty of mid thoracic vertebral body. IMPRESSION: No active cardiopulmonary disease. Electronically Signed   By: Lupita Raider, M.D.   On: 12/23/2015 19:44   Dg Pelvis 1-2 Views  12/23/2015  CLINICAL DATA:  Initial encounter for fall today with right thigh pain. EXAM: PELVIS - 1-2 VIEW COMPARISON:  None. FINDINGS: Single AP view the pelvis. The femoral shaft fracture is better evaluated and visualized on femur films. Bilateral hip arthroplasties. Prior sacral plasty. Remote right pubic rami fractures. Osteopenia. IMPRESSION: Right femoral shaft fracture, better evaluated on dedicated radiographs. Electronically Signed   By: Jeronimo Greaves M.D.   On: 12/23/2015 19:43   Dg C-arm 1-60 Min-no Report  12/24/2015  CLINICAL DATA: surgery, elective C-ARM 1-60 MINUTES Fluoroscopy was utilized by the requesting physician.  No radiographic interpretation.   Dg Femur 1v Right  12/24/2015  CLINICAL DATA:  82 year old female with a history of fracture repair EXAM: RIGHT FEMUR 1 VIEW; DG C-ARM 1-60 MIN-NO REPORT COMPARISON:  12/23/2015  FINDINGS: Intraoperative fluoroscopic spot images during femur repair. 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 Similar appearance of total right knee arthroplasty. Interval placement of a lateral buttress plate fixation spanning right femoral diaphyseal fracture, with distal interlocking screw fixation and proximal cerclage wires. IMPRESSION: Limited intraoperative fluoroscopic spot images of open reduction internal fixation of femoral diaphyseal fracture with buttress plate and screw/ cerclage wire fixation, as above. Please refer to the dictated operative report for full details of intraoperative findings and procedure. Signed, Yvone Neu. Loreta Ave, DO [facility] Electronically Signed   By: Gilmer Mor D.O.   On: 12/24/2015 19:09   Dg Femur, Min 2 Views Right  12/23/2015  CLINICAL DATA:  Fall today with right thigh pain. EXAM: RIGHT FEMUR 2 VIEWS COMPARISON:  12/22/2005 pelvic radiographs FINDINGS: Right hip prosthesis noted. Comminuted femoral fracture of the mid diaphysis to the distal metaphysis with 7 cm overlap and several butterfly fragments noted. Distal fracture extent extends to the margin of the femoral component of the knee prosthesis. There is deformity of the right inferior pubic ramus, likely from an old fracture. IMPRESSION: 1. Comminuted femoral fracture extends from the mid diaphysis to the distal metaphysis, with several butterfly fragments along with bony overlap and mild displacement. The distal fracture margin extends to the edge of the femoral component of the knee prosthesis. The patient also has a hip prosthesis. 2. Old healed fractures of the right pubic rami. Electronically Signed   By: Gaylyn Rong M.D.   On: 12/23/2015 19:44    Assessment/Plan 1. Type I or II open displaced comminuted fracture of shaft of right femur with routine  healing, subsequent encounter -nursing advised to remove staples from  all incisions except the distal one -cont to monitor through the end of the week, then reassess  2. Allergic rhinitis with postnasal drip -cont antihistamine -encouraged water intake  Family/ staff Communication: discussed with nursing  Labs/tests ordered:  No new Hamish Banks L. Devaris Quirk, D.O. Geriatrics [facility] [address], [place] [address] Cell Phone (Mon-Fri 8am-5pm):  [phone] On Call:  [phone] & follow prompts after 5pm & weekends Office Phone:  [phone] Office Fax:  [phone]

## 2016-01-13 ENCOUNTER — Non-Acute Institutional Stay (SKILLED_NURSING_FACILITY): Payer: Medicare Other | Admitting: Adult Health

## 2016-01-13 ENCOUNTER — Encounter: Payer: Self-pay | Admitting: Adult Health

## 2016-01-13 DIAGNOSIS — S72351E Displaced comminuted fracture of shaft of right femur, subsequent encounter for open fracture type I or II with routine healing: Secondary | ICD-10-CM

## 2016-01-13 DIAGNOSIS — Z9660 Presence of unspecified orthopedic joint implant: Secondary | ICD-10-CM | POA: Diagnosis not present

## 2016-01-13 DIAGNOSIS — Z96649 Presence of unspecified artificial hip joint: Secondary | ICD-10-CM

## 2016-01-13 NOTE — Progress Notes (Signed)
Patient ID: [name], female   DOB: 21-Oct-1924, 82 y.o.   MRN: [id number]     Nursing Home Location:  [facility]   Code Status: DNR  Patient Care Team: Chilton Greathouse, MD as PCP - General (Internal Medicine) [facility] Nadara Mustard, MD as Consulting Physician (Orthopedic Surgery) Venancio Poisson, MD as Consulting Physician (Dermatology) Maris Berger, MD as Consulting Physician (Ophthalmology)   Place of Service: SNF (31)  Chief Complaint  Patient presents with  . Acute Visit    f/u assessment of right hip incision    HPI:  82 y.o. female residing at [name], rehab section. I am here to review her right distal hip incision. She is s/p a comminuted right femur fracture periprosthetic with total hip and total knee replacement on 12/27/15.  Currently residing in rehab for therapy. She is non weight bearing at this point per ortho. The right upper incision staples were removed last week but due to some erythema and issues with healing removing the lower staples has been delayed. She denies any pain to this area, has not had a fever, warmth, or drainage. She is currently using ultram for pain and feels that it is well controlled.     Review of Systems:  Review of Systems  Constitutional: Negative for fever, chills, activity change and appetite change.  Respiratory: Negative for shortness of breath.   Cardiovascular: Negative for chest pain.  Musculoskeletal: Positive for arthralgias and gait problem. Negative for back pain.    Medications: Patient's Medications  New Prescriptions   No medications on file  Previous Medications   ACETAMINOPHEN (TYLENOL) 650 MG CR TABLET    Take 650 mg by mouth every 6 (six) hours as needed for pain.   AMLODIPINE (NORVASC) 10 MG TABLET    Take 1 tablet (10 mg total) by mouth daily.   ASPIRIN EC 81 MG TABLET    Take 81 mg by mouth daily.   BETA CAROTENE W/MINERALS (OCUVITE) TABLET     Take 1 tablet by mouth daily.   CALCIUM CARBONATE (CALCIUM 600) 600 MG TABS TABLET    Take 600 mg by mouth 2 (two) times daily with a meal.   DULOXETINE (CYMBALTA) 60 MG CAPSULE    Take 60 mg by mouth. Take one capsule in the morning   GABAPENTIN (NEURONTIN) 300 MG CAPSULE    Take 300 mg by mouth 2 (two) times daily.   HYDROMORPHONE (DILAUDID) 2 MG TABLET    Take by mouth. Take 1/2 tablet every 4 hours as needed severe pain 6-10, unrelieved with Tramadol   OLMESARTAN (BENICAR) 20 MG TABLET    Take 10 mg by mouth daily.    POLYETHYLENE GLYCOL (MIRALAX / GLYCOLAX) PACKET    Take 17 g by mouth daily.   TRAMADOL (ULTRAM) 50 MG TABLET    Take by mouth. Take one every 6 hours as needed for pain 1-5, 2 for 6-10  Modified Medications   No medications on file  Discontinued Medications   No medications on file     Physical Exam:   Physical Exam  Constitutional: She is oriented to person, place, and time. No distress.  Musculoskeletal: She exhibits edema (noted to right knee with mild ecchymoses).  Right hip internally rotated.  Restricted movement per ortho  Neurological: She is alert and oriented to person, place, and time.  Skin: Skin is warm and dry. No rash noted. She is not diaphoretic. There is erythema (mild to right  distal femur incision ). No pallor.  Right distal femur incision without drainage or warmth. Well approximated  Psychiatric: Affect normal.    Wt Readings from Last 3 Encounters:  01/07/16 134 lb 9.6 oz (61.054 kg)  12/31/15 126 lb (57.153 kg)  11/19/15 134 lb (60.782 kg)     Labs reviewed/Significant Diagnostic Results:  Basic Metabolic Panel:  Recent Labs  40/98/11 1109  11/15/15 0921 11/28/15 12/23/15 1950  NA 128*  < > 128* 137 128*  K 3.4*  < > 3.6 3.8 4.3  CL 86*  --  93*  --  96*  CO2 30  --  27  --  24  GLUCOSE 120*  --  97  --  137*  BUN 12  < > CREATININE 0.91  < > 1.03* 0.7 0.99  CALCIUM 9.3  --  9.3  --  7.7*  < > = values in this  interval not displayed. Liver Function Tests:  Recent Labs  12/23/15 1950  AST 23  ALT 15  ALKPHOS 82  BILITOT 0.5  PROT 5.3*  ALBUMIN 3.1*   No results for input(s): LIPASE, AMYLASE in the last 8760 hours. No results for input(s): AMMONIA in the last 8760 hours. CBC:  Recent Labs  11/15/15 0921 12/23/15 1857 12/25/15 0459  WBC 10.4 9.5 10.5  NEUTROABS  --  7.0 7.0  HGB 12.1 10.1* 7.3*  HCT 36.3 30.6* 21.7*  MCV 94.3 91.6 91.6  PLT 262 203 206   CBG: No results for input(s): GLUCAP in the last 8760 hours. TSH: No results for input(s): TSH in the last 8760 hours. A1C: No results found for: HGBA1C Lipid Panel: No results for input(s): CHOL, HDL, LDLCALC, TRIG, CHOLHDL, LDLDIRECT in the last 8760 hours.     Assessment/Plan  1. Type I or II open displaced comminuted fracture of shaft of right femur with routine healing, subsequent encounter -s/p surgery, here for rehab -pain well controlled -upper incision of the hip healing well, right distal incision improved with minimal erythema -remove every other staples to distal incision and if no dehiscence may remove other staples in am  2. S/P total hip arthroplasty -followed by ortho, non weight bearing -continue PT     Peggye Ley, ANP [facility] [phone]

## 2016-01-18 DIAGNOSIS — M810 Age-related osteoporosis without current pathological fracture: Secondary | ICD-10-CM | POA: Insufficient documentation

## 2016-01-22 ENCOUNTER — Other Ambulatory Visit: Payer: Self-pay | Admitting: *Deleted

## 2016-01-22 MED ORDER — TRAMADOL HCL 50 MG PO TABS: ORAL_TABLET | ORAL | Status: AC

## 2016-01-22 NOTE — Telephone Encounter (Signed)
[facility] 

## 2016-02-14 ENCOUNTER — Encounter: Payer: Self-pay | Admitting: Adult Health

## 2016-02-14 ENCOUNTER — Non-Acute Institutional Stay (SKILLED_NURSING_FACILITY): Payer: Medicare Other | Admitting: Adult Health

## 2016-02-14 DIAGNOSIS — R413 Other amnesia: Secondary | ICD-10-CM

## 2016-02-14 DIAGNOSIS — K5903 Drug induced constipation: Secondary | ICD-10-CM | POA: Diagnosis not present

## 2016-02-14 DIAGNOSIS — S72351E Displaced comminuted fracture of shaft of right femur, subsequent encounter for open fracture type I or II with routine healing: Secondary | ICD-10-CM | POA: Diagnosis not present

## 2016-02-14 DIAGNOSIS — S22000D Wedge compression fracture of unspecified thoracic vertebra, subsequent encounter for fracture with routine healing: Secondary | ICD-10-CM

## 2016-02-14 DIAGNOSIS — I10 Essential (primary) hypertension: Secondary | ICD-10-CM | POA: Diagnosis not present

## 2016-02-14 LAB — BASIC METABOLIC PANEL
BUN: 13 mg/dL (ref 4–21)
Creatinine: 0.7 mg/dL (ref 0.5–1.1)
GLUCOSE: 115 mg/dL
Potassium: 4.7 mmol/L (ref 3.4–5.3)
Sodium: 136 mmol/L — AB (ref 137–147)

## 2016-02-14 LAB — CBC AND DIFFERENTIAL
HEMATOCRIT: 32 % — AB (ref 36–46)
HEMOGLOBIN: 10.3 g/dL — AB (ref 12.0–16.0)
PLATELETS: 276 10*3/uL (ref 150–399)
WBC: 7.9 10*3/mL

## 2016-02-14 NOTE — Progress Notes (Signed)
Patient ID: [name], female   DOB: [birth year], 82 y.o.   MRN: [id number]    Nursing Home Location:  [facility]   Code Status: DNR  Patient Care Team: Chilton Greathouse, MD as PCP - General (Internal Medicine) [facility] Monica Mustard, MD as Consulting Physician (Orthopedic Surgery) Venancio Poisson, MD as Consulting Physician (Dermatology) Maris Berger, MD as Consulting Physician (Ophthalmology)  Goals of care: Advanced Directive information Advanced Directives 01/07/2016  Does patient have an advance directive? Yes  Type of Estate agent of [place];Living will;Out of facility DNR (pink MOST or yellow form)  Copy of advanced directive(s) in chart? Yes  Pre-existing out of facility DNR order (yellow form or pink MOST form) Yellow form placed in chart (order not valid for inpatient use)     Place of Service: SNF (31)  Chief Complaint  Patient presents with  . Medical Management of Chronic Issues    HPI:  82 y.o.female residing at [name], rehab section. I am here to review her chronic medical issues.  VS have been stable over the past month. Weight have been variable in the records. She was admitted to rehab on 1/6 after a fall with subsequent comminuted spiral fracture to the right femur. She is s/p ORIF on 1/3.  She has been non weight bearing since arrival until 2/14 when she was given touch down weight bearing transfers.  She reports minimal pain and has used ultram two times in the past two weeks. The staff reports some mild edema to the RLE without pain, redness, tenderness, and they are requesting ted hose.   HTN: BP controlled on norvasc and benicar  Constipation: normal BMs with miralax  MCI: noted worsening over time MMSE 27/30 on 12/29/15 with difficulty on recall and the date  Thoracic compression fractures T7 and T8: using supportive brace, minimal pain, on reclast for OP, as well  as calcium with Vit D     Review of Systems:  Review of Systems  Constitutional: Positive for activity change. Negative for fever, chills, diaphoresis, appetite change and fatigue.  HENT: Negative for congestion.   Respiratory: Negative for cough, shortness of breath and wheezing.   Cardiovascular: Positive for leg swelling. Negative for chest pain and palpitations.  Gastrointestinal: Negative for nausea, abdominal pain, diarrhea, constipation and abdominal distention.  Genitourinary: Negative for dysuria and difficulty urinating.  Musculoskeletal: Positive for back pain, arthralgias and gait problem.  Neurological: Negative for dizziness and facial asymmetry.  Psychiatric/Behavioral: Negative for behavioral problems, confusion, sleep disturbance and agitation. The patient is not nervous/anxious.     Medications: Patient's Medications  New Prescriptions   No medications on file  Previous Medications   ACETAMINOPHEN (TYLENOL) 650 MG CR TABLET    Take 650 mg by mouth every 6 (six) hours as needed for pain.   AMLODIPINE (NORVASC) 10 MG TABLET    Take 1 tablet (10 mg total) by mouth daily.   ASPIRIN EC 81 MG TABLET    Take 81 mg by mouth daily.   BETA CAROTENE W/MINERALS (OCUVITE) TABLET    Take 1 tablet by mouth daily.   CALCIUM CITRATE-VITAMIN D (CITRACAL+D) 315-200 MG-UNIT TABLET    Take 2 tablets by mouth 2 (two) times daily.   DULOXETINE (CYMBALTA) 60 MG CAPSULE    Take 60 mg by mouth. Take one capsule in the morning   FEXOFENADINE (ALLEGRA) 180 MG TABLET    Take 180 mg by mouth daily.   GABAPENTIN (  NEURONTIN) 300 MG CAPSULE    Take 300 mg by mouth 2 (two) times daily.   HYDROMORPHONE (DILAUDID) 2 MG TABLET    Take by mouth. Take 1/2 tablet every 4 hours as needed severe pain 6-10, unrelieved with Tramadol   OLMESARTAN (BENICAR) 20 MG TABLET    Take 10 mg by mouth daily.    POLYETHYLENE GLYCOL (MIRALAX / GLYCOLAX) PACKET    Take 17 g by mouth daily.   TRAMADOL (ULTRAM) 50 MG TABLET     Take one tablet by mouth at 6am and 12 noon for pain  Modified Medications   No medications on file  Discontinued Medications   CALCIUM CARBONATE (CALCIUM 600) 600 MG TABS TABLET    Take 600 mg by mouth 2 (two) times daily with a meal.     Physical Exam:  Filed Vitals:   02/14/16 1010  Weight: 121 lb 3.2 oz (54.976 kg)    Physical Exam  Constitutional: She is oriented to person, place, and time. No distress.  HENT:  Head: Normocephalic and atraumatic.  Cardiovascular: Normal rate and regular rhythm.   +1 edema to the right ankle, no calf tenderness, swelling, or warmth  Pulmonary/Chest: Effort normal and breath sounds normal. No respiratory distress.  Abdominal: Soft. Bowel sounds are normal. She exhibits no distension.  Musculoskeletal: She exhibits no tenderness (hip, spine, knee).  Slight foot drop to both feet, no ROM performed to the hip or knee  Neurological: She is alert and oriented to person, place, and time.  Skin: Skin is warm and dry. She is not diaphoretic.  Fragile, thin , dry skin  Psychiatric: Affect normal.    Wt Readings from Last 3 Encounters:  02/14/16 121 lb 3.2 oz (54.976 kg)  01/07/16 134 lb 9.6 oz (61.054 kg)  12/31/15 126 lb (57.153 kg)     Labs reviewed/Significant Diagnostic Results:  Basic Metabolic Panel:  Recent Labs  16/10/96 1109  11/15/15 0921 11/28/15 12/23/15 1950  NA 128*  < > 128* 137 128*  K 3.4*  < > 3.6 3.8 4.3  CL 86*  --  93*  --  96*  CO2 30  --  27  --  24  GLUCOSE 120*  --  97  --  137*  BUN 12  < > 16 16 15   CREATININE 0.91  < > 1.03* 0.7 0.99  CALCIUM 9.3  --  9.3  --  7.7*  < > = values in this interval not displayed. Liver Function Tests:  Recent Labs  12/23/15 1950  AST 23  ALT 15  ALKPHOS 82  BILITOT 0.5  PROT 5.3*  ALBUMIN 3.1*   No results for input(s): LIPASE, AMYLASE in the last 8760 hours. No results for input(s): AMMONIA in the last 8760 hours. CBC:  Recent Labs  11/15/15 0921  12/23/15 1857 12/25/15 0459  WBC 10.4 9.5 10.5  NEUTROABS  --  7.0 7.0  HGB 12.1 10.1* 7.3*  HCT 36.3 30.6* 21.7*  MCV 94.3 91.6 91.6  PLT 262 203 206   CBG: No results for input(s): GLUCAP in the last 8760 hours. TSH: No results for input(s): TSH in the last 8760 hours. A1C: No results found for: HGBA1C Lipid Panel: No results for input(s): CHOL, HDL, LDLCALC, TRIG, CHOLHDL, LDLDIRECT in the last 8760 hours.     Assessment/Plan  1. Type I or II open displaced comminuted fracture of shaft of right femur with routine healing, subsequent encounter S/p repair Continue ultram prn Follow up  with ortho Touch down wbat for transfers per ortho May wear compression hose Monitor for increased swelling, redness, or calf tenderness  2. Constipation due to pain medication -controlled -continue miralax  3. Essential hypertension, benign -controlled -continue benicar and norvasc  4. Thoracic compression fracture, with routine healing, subsequent encounter -improved pain control -continue supportive brace  5. Memory loss -mild but progressively worsening with increased assistance needed -consider aricept -may need AL care once rehab has finished  6. Hyponatremia -check BMP  7. Anemia acute blood loss -check CBC, s/p transfusion  Labs CBC and BMP  Peggye Ley, ANP [facility] [phone]

## 2016-03-19 ENCOUNTER — Encounter: Payer: Self-pay | Admitting: Adult Health

## 2016-03-19 ENCOUNTER — Non-Acute Institutional Stay (SKILLED_NURSING_FACILITY): Payer: Medicare Other | Admitting: Adult Health

## 2016-03-19 DIAGNOSIS — L853 Xerosis cutis: Secondary | ICD-10-CM | POA: Diagnosis not present

## 2016-03-19 DIAGNOSIS — S72351E Displaced comminuted fracture of shaft of right femur, subsequent encounter for open fracture type I or II with routine healing: Secondary | ICD-10-CM | POA: Diagnosis not present

## 2016-03-19 DIAGNOSIS — K5903 Drug induced constipation: Secondary | ICD-10-CM

## 2016-03-19 DIAGNOSIS — D62 Acute posthemorrhagic anemia: Secondary | ICD-10-CM

## 2016-03-19 DIAGNOSIS — R634 Abnormal weight loss: Secondary | ICD-10-CM

## 2016-03-19 DIAGNOSIS — S22000D Wedge compression fracture of unspecified thoracic vertebra, subsequent encounter for fracture with routine healing: Secondary | ICD-10-CM

## 2016-03-19 DIAGNOSIS — I10 Essential (primary) hypertension: Secondary | ICD-10-CM

## 2016-03-19 NOTE — Progress Notes (Signed)
Patient ID: [name], female   DOB: Dec 26, 1923, 82 y.o.   MRN: [id number]    Nursing Home Location:  [facility]   Code Status: DNR  Patient Care Team: Chilton Greathouse, MD as PCP - General (Internal Medicine) [facility] Nadara Mustard, MD as Consulting Physician (Orthopedic Surgery) Venancio Poisson, MD as Consulting Physician (Dermatology) Maris Berger, MD as Consulting Physician (Ophthalmology)  Goals of care: Advanced Directive information Advanced Directives 01/07/2016  Does patient have an advance directive? Yes  Type of Estate agent of [place];Living will;Out of facility DNR (pink MOST or yellow form)  Copy of advanced directive(s) in chart? Yes  Pre-existing out of facility DNR order (yellow form or pink MOST form) Yellow form placed in chart (order not valid for inpatient use)     Place of Service: SNF (31)  Chief Complaint  Patient presents with  . Medical Management of Chronic Issues    HPI:  82 y.o.female residing at [name], rehab section. I am here to review her chronic medical issues.  VS have been stable over the past month. Weight have been variable in the records. She was admitted to rehab on 1/6 after a fall with subsequent comminuted spiral fracture to the right femur.    1. Thoracic compression fracture, with routine healing, subsequent encounter -s/p kyphoplasty, has frequent falls and OP -using heating pad for pain prn  2. Essential hypertension, benign -controlled with norvasc and benicar  3. Postoperative anemia due to acute blood loss Improved s/p transfusion in the hospital -H/H 10.3/32.4  4. Type I or II open displaced comminuted fracture of shaft of right femur with routine healing, subsequent encounter She is s/p ORIF on 1/3.  Now bearing weight and working with PT. No longer on pain meds. Plan to move to [place]  5. Constipation due to pain  medication -reports normal BMs on senokot  6. Xerosis of skin -currently on allegra for itchy dry skin but does not think it helps     Review of Systems:  Review of Systems  Constitutional: Positive for unexpected weight change. Negative for fever, chills, diaphoresis, activity change, appetite change and fatigue.  HENT: Negative for congestion.   Respiratory: Negative for cough, shortness of breath and wheezing.   Cardiovascular: Negative for chest pain, palpitations and leg swelling.  Gastrointestinal: Negative for nausea, abdominal pain, diarrhea, constipation and abdominal distention.  Genitourinary: Negative for dysuria and difficulty urinating.  Musculoskeletal: Positive for back pain, arthralgias and gait problem.  Skin: Negative for wound.       Dry skin, itchy  Neurological: Negative for dizziness and facial asymmetry.  Psychiatric/Behavioral: Negative for behavioral problems, confusion, sleep disturbance and agitation. The patient is not nervous/anxious.     Medications: Patient's Medications  New Prescriptions   No medications on file  Previous Medications   ACETAMINOPHEN (TYLENOL) 650 MG CR TABLET    Take 650 mg by mouth every 6 (six) hours as needed for pain.   AMLODIPINE (NORVASC) 10 MG TABLET    Take 1 tablet (10 mg total) by mouth daily.   ASPIRIN EC 81 MG TABLET    Take 81 mg by mouth daily.   BETA CAROTENE W/MINERALS (OCUVITE) TABLET    Take 1 tablet by mouth daily.   CALCIUM CARBONATE (TUMS - DOSED IN MG ELEMENTAL CALCIUM) 500 MG CHEWABLE TABLET    Chew 2 tablets by mouth 2 (two) times daily.   CHOLECALCIFEROL (VITAMIN D) 1000 UNITS TABLET  Take 2,000 Units by mouth daily.   DULOXETINE (CYMBALTA) 60 MG CAPSULE    Take 60 mg by mouth. Take one capsule in the morning   GABAPENTIN (NEURONTIN) 300 MG CAPSULE    Take 300 mg by mouth 2 (two) times daily.   HYDROMORPHONE (DILAUDID) 2 MG TABLET    Take by mouth. Take 1/2 tablet every 4 hours as needed severe pain  6-10, unrelieved with Tramadol   OLMESARTAN (BENICAR) 20 MG TABLET    Take 10 mg by mouth daily.    SENNA-DOCUSATE (SENOKOT-S) 8.6-50 MG TABLET    Take 3 tablets by mouth at bedtime.   TRAMADOL (ULTRAM) 50 MG TABLET    Take one tablet by mouth at 6am and 12 noon for pain  Modified Medications   No medications on file  Discontinued Medications   CALCIUM CITRATE-VITAMIN D (CITRACAL+D) 315-200 MG-UNIT TABLET    Take 2 tablets by mouth 2 (two) times daily.   FEXOFENADINE (ALLEGRA) 180 MG TABLET    Take 180 mg by mouth daily.   POLYETHYLENE GLYCOL (MIRALAX / GLYCOLAX) PACKET    Take 17 g by mouth daily.     Physical Exam:  Filed Vitals:   03/19/16 1206  BP: 108/60  Pulse: 76  Temp: 98.4 F (36.9 C)  Resp: 16  Weight: 125 lb 6.4 oz (56.881 kg)  SpO2: 93%   Wt Readings from Last 3 Encounters:  03/19/16 125 lb 6.4 oz (56.881 kg)  02/14/16 121 lb 3.2 oz (54.976 kg)  01/07/16 134 lb 9.6 oz (61.054 kg)    Physical Exam  Constitutional: She is oriented to person, place, and time. No distress.  HENT:  Head: Normocephalic and atraumatic.  Cardiovascular: Normal rate and regular rhythm.   +1 edema to the right ankle, no calf tenderness, swelling, or warmth  Pulmonary/Chest: Effort normal and breath sounds normal. No respiratory distress.  Abdominal: Soft. Bowel sounds are normal. She exhibits no distension.  Musculoskeletal: She exhibits no edema or tenderness.  Slight foot drop to both feet, no ROM performed to the hip or knee  Neurological: She is alert and oriented to person, place, and time.  Skin: Skin is warm and dry. She is not diaphoretic.  Fragile, thin , dry skin  Psychiatric: Affect normal.    Labs reviewed/Significant Diagnostic Results:  Basic Metabolic Panel:  Recent Labs  16/10/96 1109  11/15/15 0921 11/28/15 12/23/15 1950 02/14/16  NA 128*  < > 128* 137 128* 136*  K 3.4*  < > 3.6 3.8 4.3 4.7  CL 86*  --  93*  --  96*  --   CO2 30  --  27  --  24  --    GLUCOSE 120*  --  97  --  137*  --   BUN 12  < > 16 16 15 13   CREATININE 0.91  < > 1.03* 0.7 0.99 0.7  CALCIUM 9.3  --  9.3  --  7.7*  --   < > = values in this interval not displayed. Liver Function Tests:  Recent Labs  12/23/15 1950  AST 23  ALT 15  ALKPHOS 82  BILITOT 0.5  PROT 5.3*  ALBUMIN 3.1*   No results for input(s): LIPASE, AMYLASE in the last 8760 hours. No results for input(s): AMMONIA in the last 8760 hours. CBC:  Recent Labs  11/15/15 0921 12/23/15 1857 12/25/15 0459 02/14/16  WBC 10.4 9.5 10.5 7.9  NEUTROABS  --  7.0 7.0  --  HGB 12.1 10.1* 7.3* 10.3*  HCT 36.3 30.6* 21.7* 32*  MCV 94.3 91.6 91.6  --   PLT 262 203 206 276   CBG: No results for input(s): GLUCAP in the last 8760 hours. TSH: No results for input(s): TSH in the last 8760 hours. A1C: No results found for: HGBA1C Lipid Panel: No results for input(s): CHOL, HDL, LDLCALC, TRIG, CHOLHDL, LDLDIRECT in the last 8760 hours.     Assessment/Plan  1. Thoracic compression fracture, with routine healing, subsequent encounter -stable -continue current regimen -on reclast  2. Essential hypertension, benign -controlled -continue current meds  3. Postoperative anemia due to acute blood loss -improved  4. Type I or II open displaced comminuted fracture of shaft of right femur with routine healing, subsequent encounter -continue therapy and compression hose  5. Constipation due to pain medication -continue senkot  6. Xerosis of skin -d/c allergra -eucerin daily to skin  7. Weight loss -possibly due to etoh intake with decreased food intake -check TSH  Peggye Ley, ANP [facility] [phone]

## 2017-01-23 IMAGING — DX DG FEMUR 2+V*R*
4 series · 4 of 4 positions shown · non-contrast
Comparison: 12/22/2005 pelvic radiographs

CLINICAL DATA: Fall today with right thigh pain.

EXAM:
RIGHT FEMUR 2 VIEWS

[hip ap]
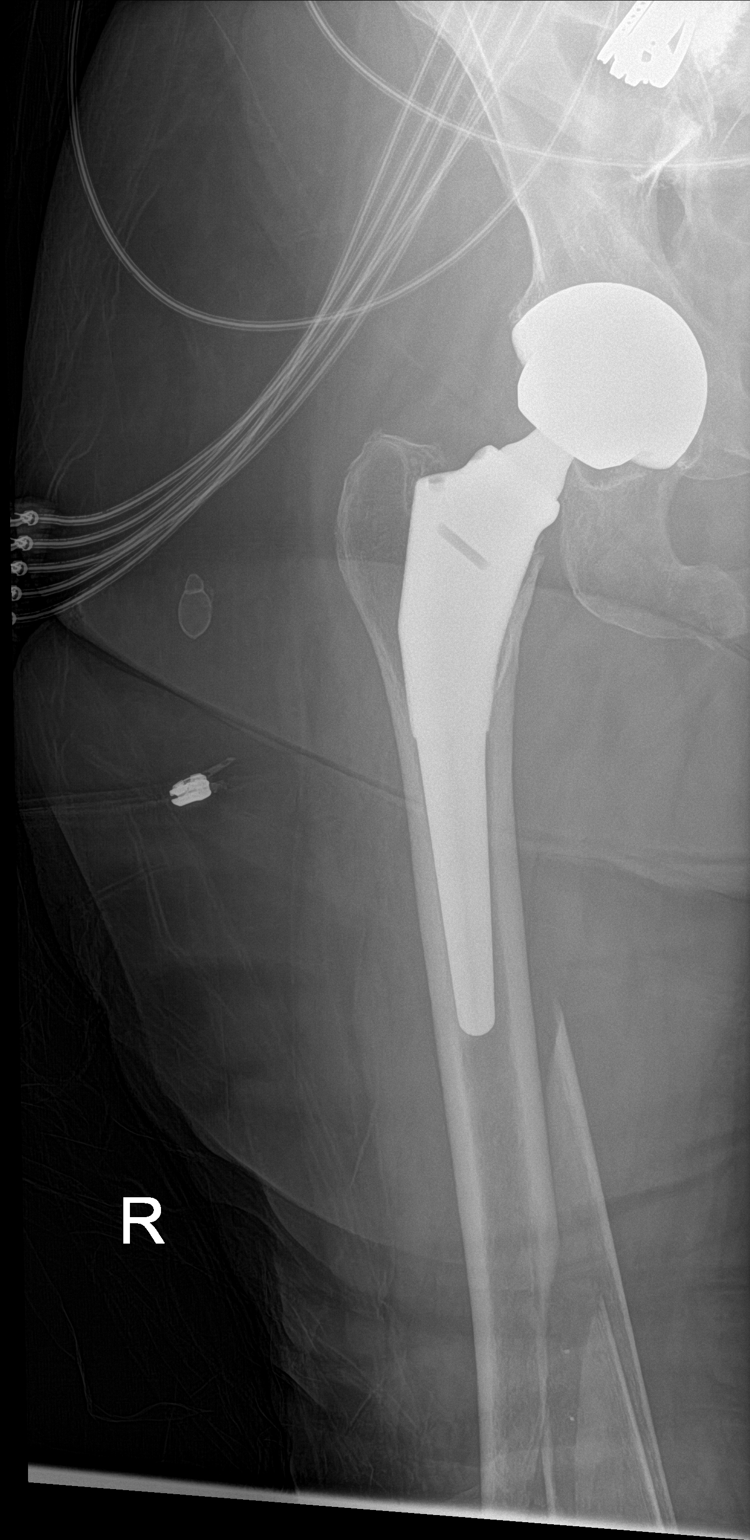

[hip lat (1 of 2)]
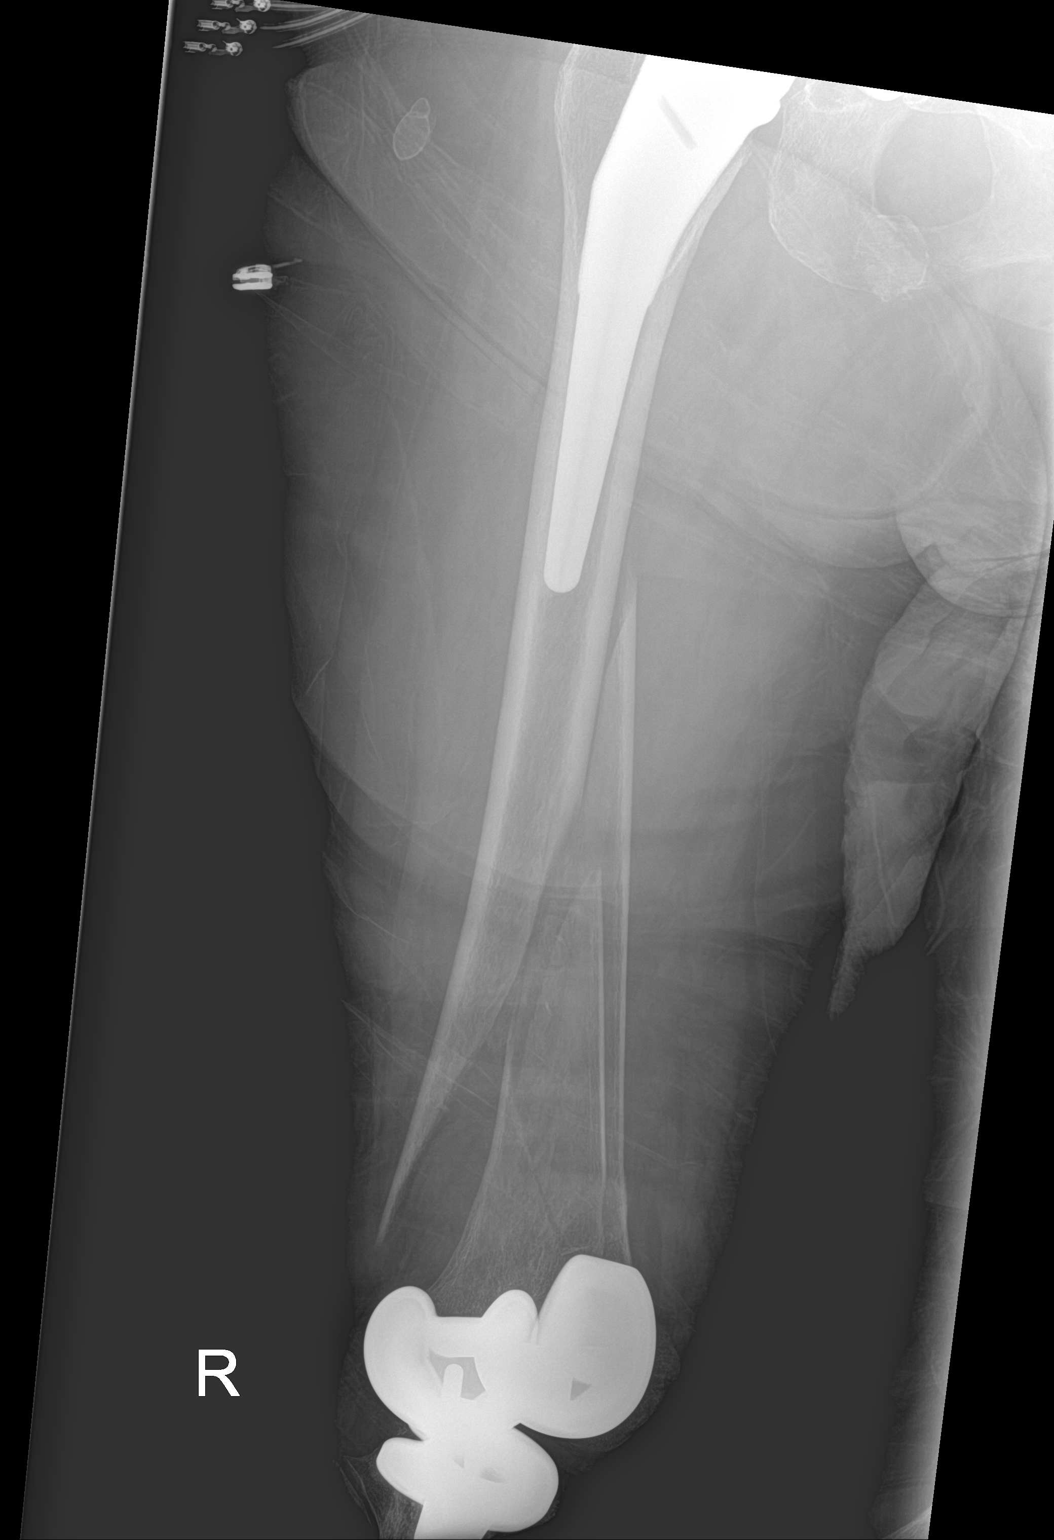

[hip x-table]
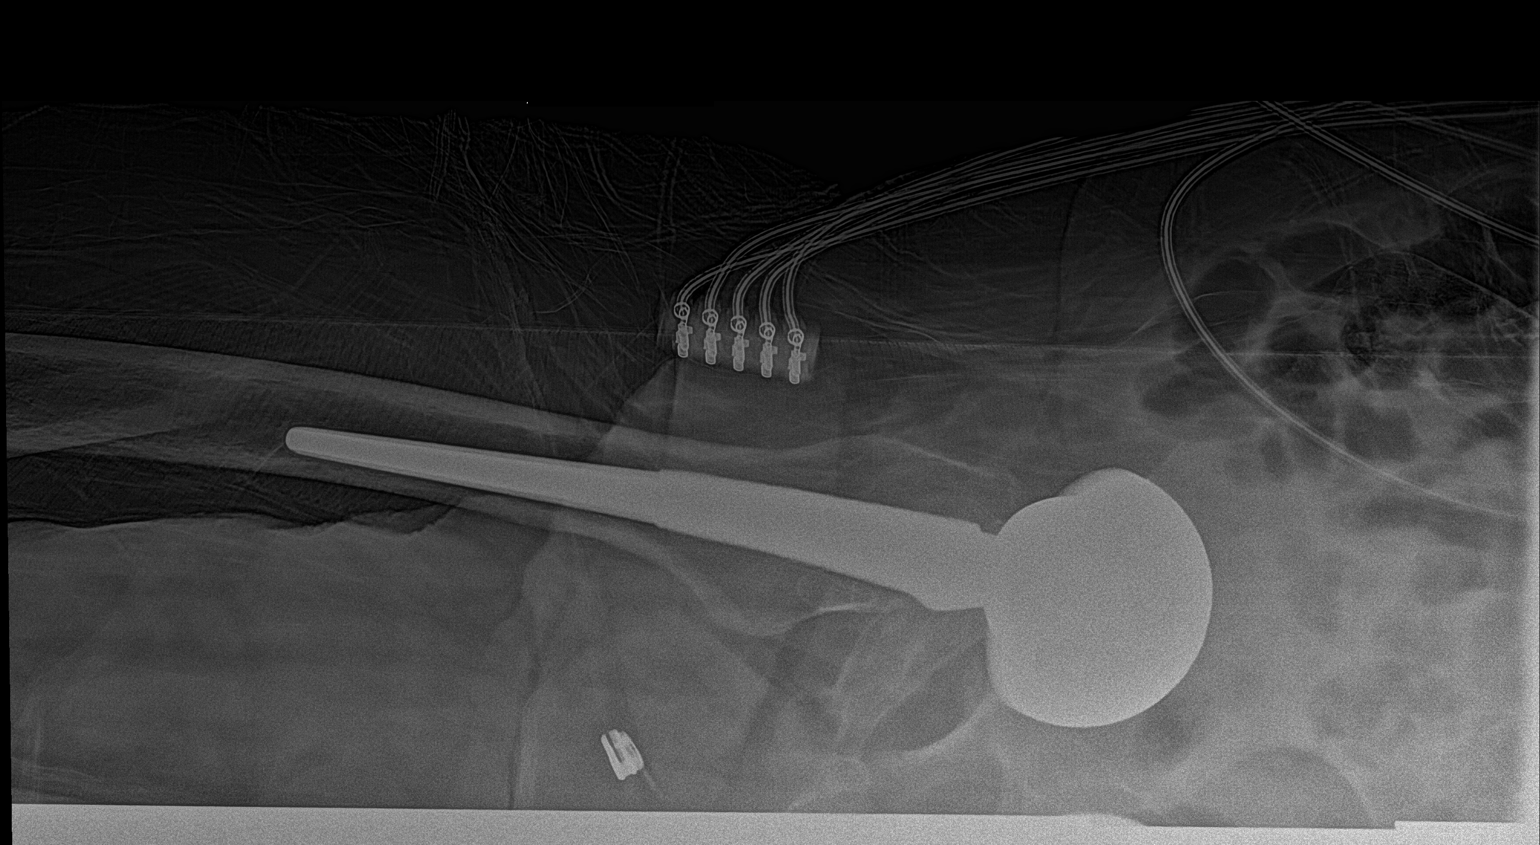

[hip lat (2 of 2)]
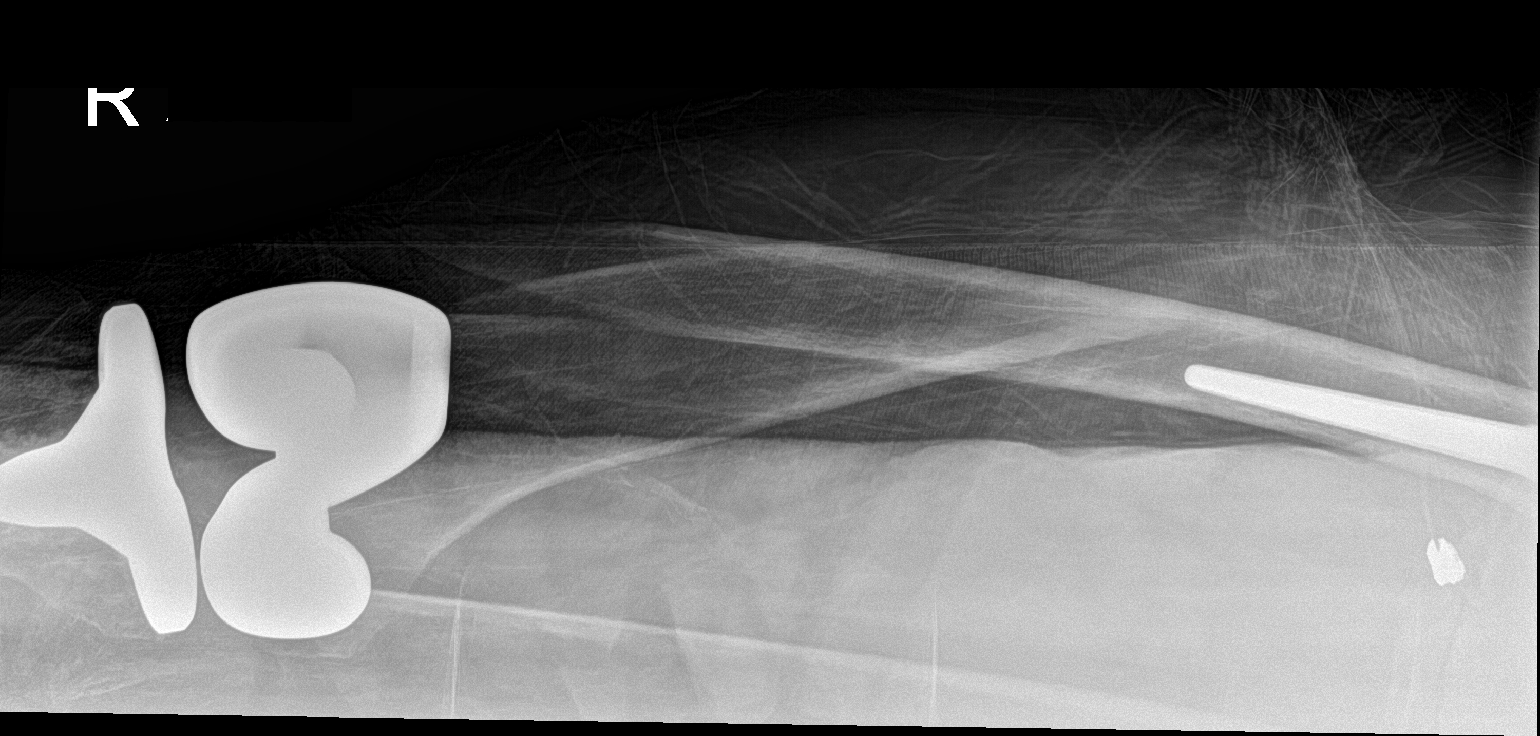

[4 of 4 positions shown; findings below may reference images not displayed]

FINDINGS: Right hip prosthesis noted.

Comminuted femoral fracture of the mid diaphysis to the distal
metaphysis with 7 cm overlap and several butterfly fragments noted.
Distal fracture extent extends to the margin of the femoral
component of the knee prosthesis. There is deformity of the right
inferior pubic ramus, likely from an old fracture.
IMPRESSION: 1. Comminuted femoral fracture extends from the mid diaphysis to the
distal metaphysis, with several butterfly fragments along with bony
overlap and mild displacement. The distal fracture margin extends to
the edge of the femoral component of the knee prosthesis. The
patient also has a hip prosthesis.
2. Old healed fractures of the right pubic rami.

## 2017-01-24 IMAGING — RF DG FEMUR 1V*R*
1 series · 4 of 4 positions shown · non-contrast
Comparison: 12/23/2015

CLINICAL DATA: 89-year-old female with a history of fracture repair

EXAM:
RIGHT FEMUR 1 VIEW; DG C-ARM 1-60 MIN-NO REPORT

[Series 1: run · 4 of 4 slices shown]
[im 1/4]
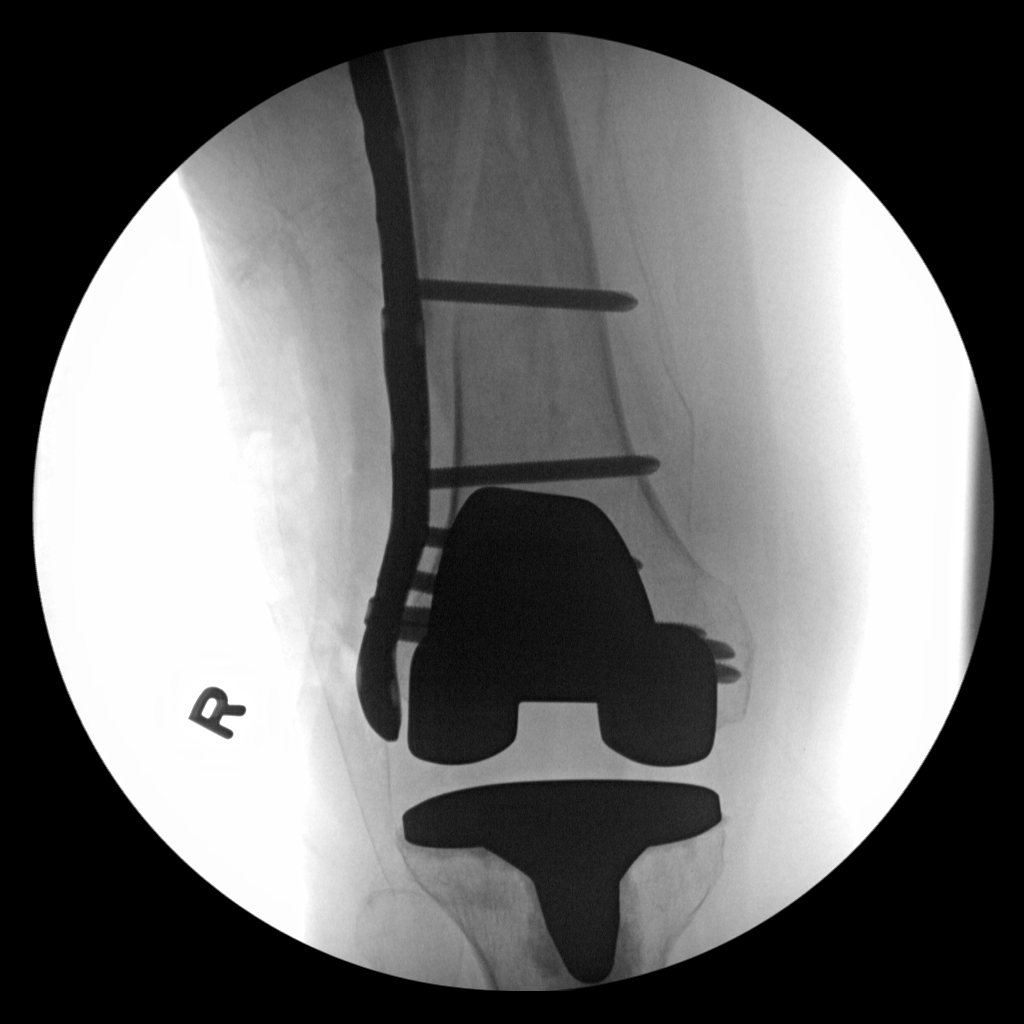
[im 2/4]
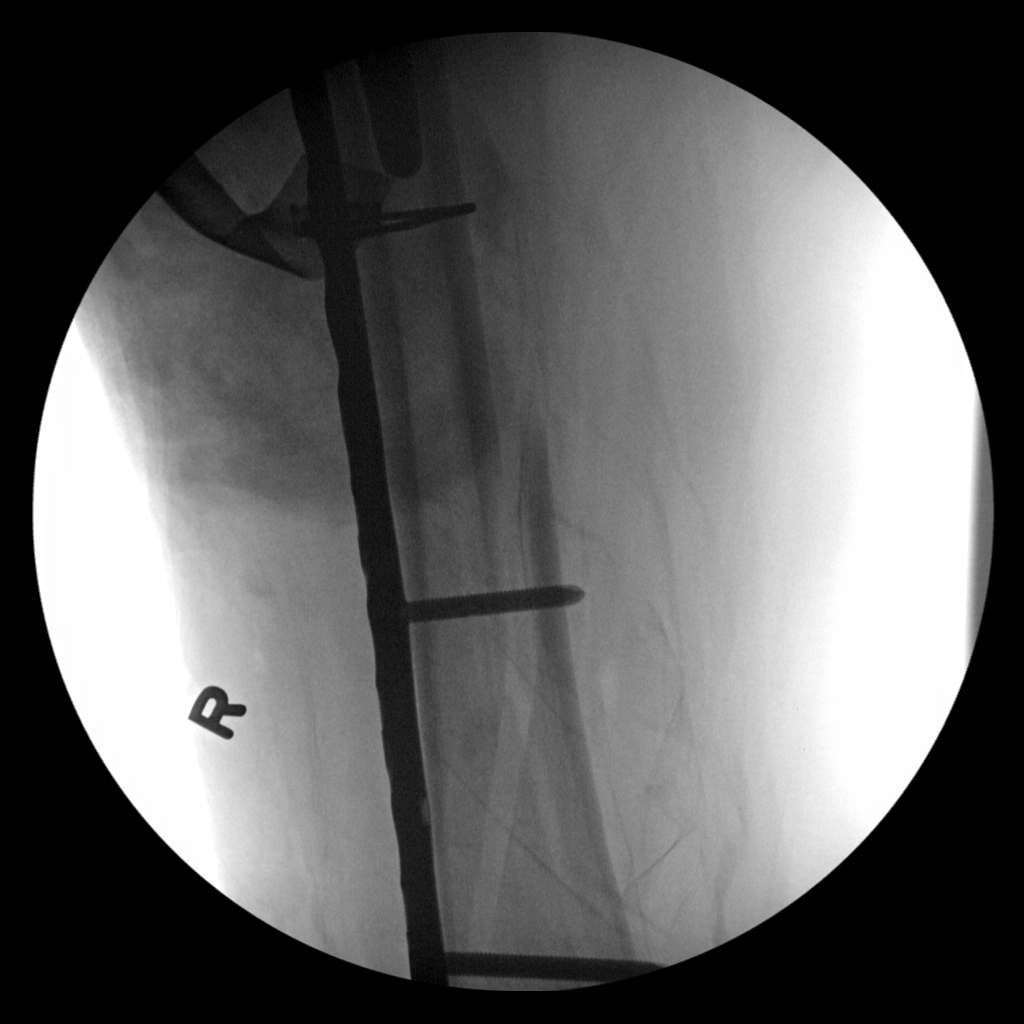
[im 3/4]
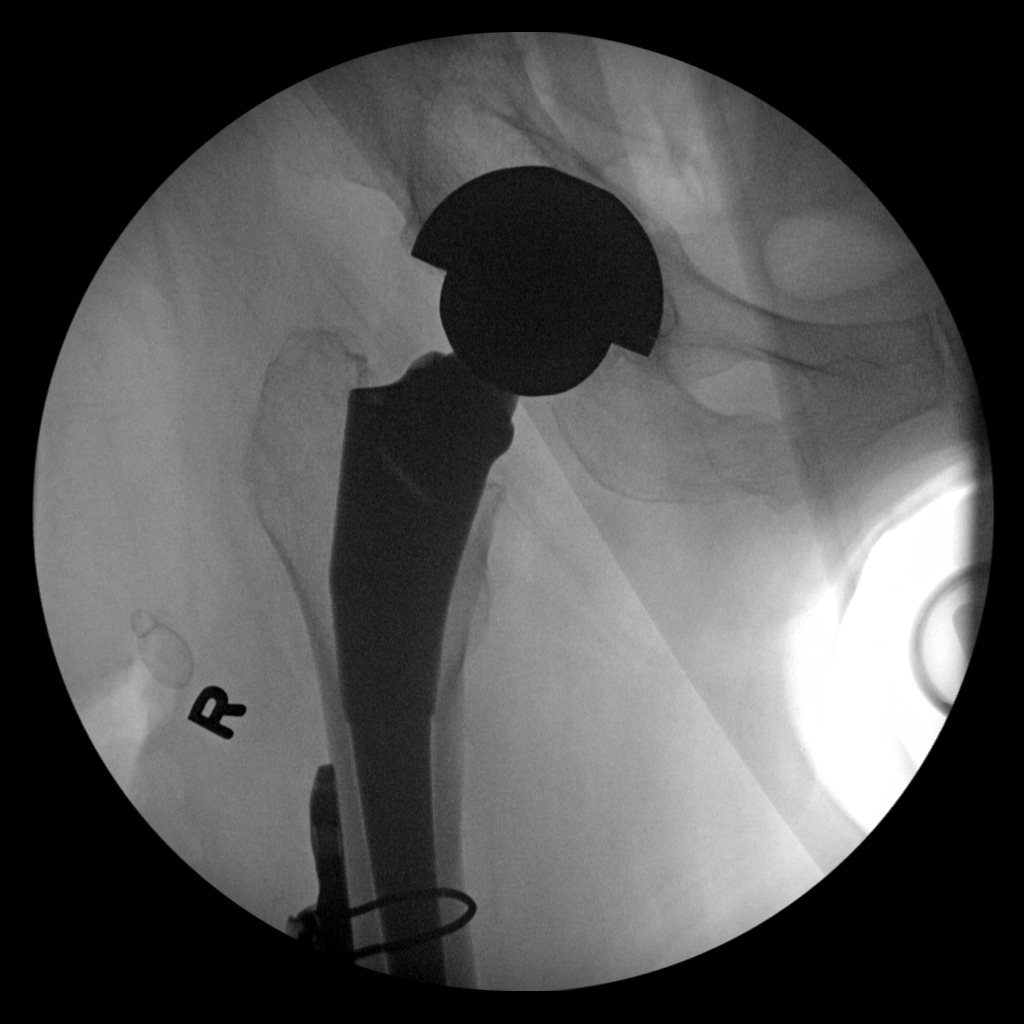
[im 4/4]
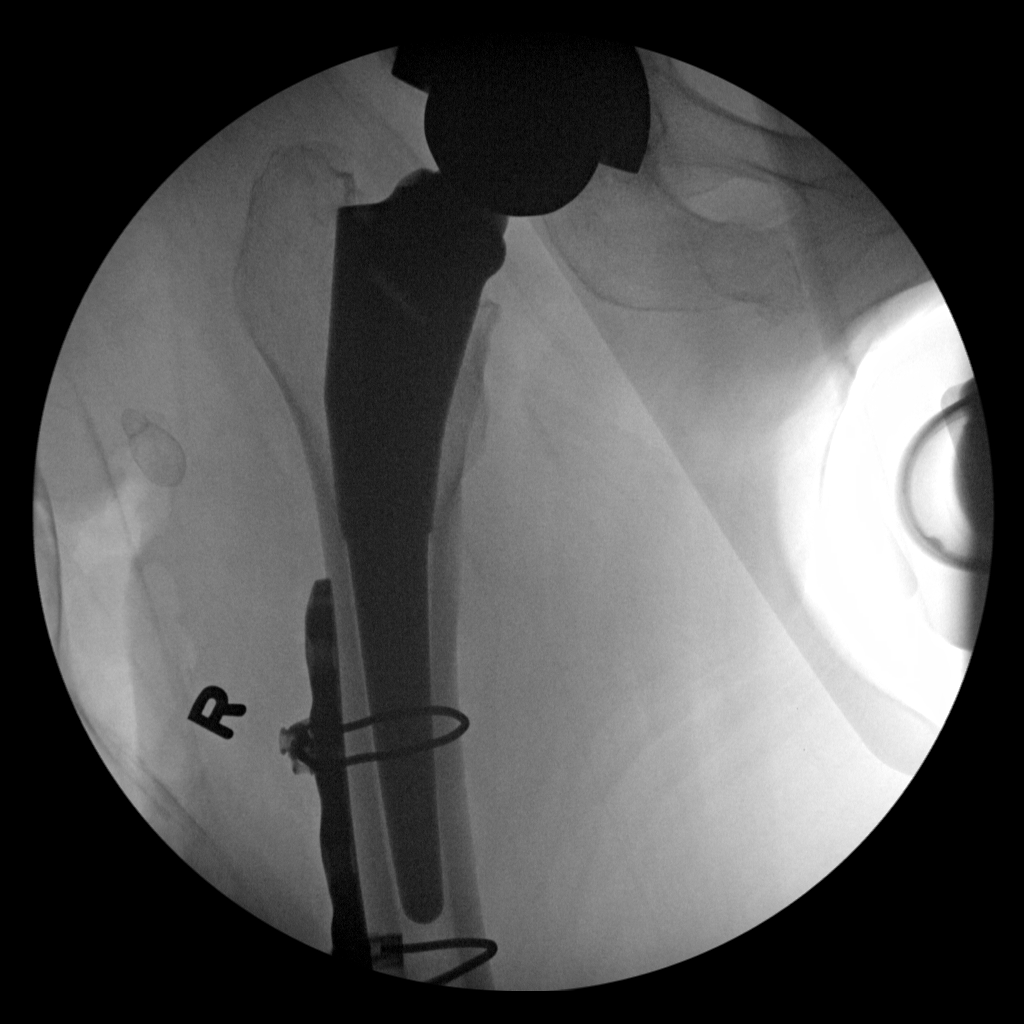

[4 of 4 positions shown; findings below may reference images not displayed]

FINDINGS: Intraoperative fluoroscopic spot images during femur repair.

Similar appearance of prior right hip total arthroplasty.

Similar appearance of total right knee arthroplasty.

Interval placement of a lateral buttress plate fixation spanning
right femoral diaphyseal fracture, with distal interlocking screw
fixation and proximal cerclage wires.
IMPRESSION: Limited intraoperative fluoroscopic spot images of open reduction
internal fixation of femoral diaphyseal fracture with buttress plate
and screw/ cerclage wire fixation, as above.

Please refer to the dictated operative report for full details of
intraoperative findings and procedure.

## 2017-12-18 ENCOUNTER — Other Ambulatory Visit: Payer: Self-pay | Admitting: Internal Medicine

## 2019-09-21 LAB — NOVEL CORONAVIRUS, NAA: SARS-CoV-2, NAA: NOT DETECTED
